# Patient Record
Sex: Female | Born: 1935 | Race: White | Hispanic: No | Smoking: Never smoker
Health system: Southern US, Community
[De-identification: ages and names within clinical notes are randomized; demographics above are authoritative.]

## PROBLEM LIST (undated history)

## (undated) DIAGNOSIS — K623 Rectal prolapse: Secondary | ICD-10-CM

## (undated) DIAGNOSIS — I509 Heart failure, unspecified: Secondary | ICD-10-CM

## (undated) DIAGNOSIS — M1990 Unspecified osteoarthritis, unspecified site: Secondary | ICD-10-CM

## (undated) DIAGNOSIS — J449 Chronic obstructive pulmonary disease, unspecified: Secondary | ICD-10-CM

## (undated) HISTORY — PX: OTHER SURGICAL HISTORY: SHX169

---

## 2007-06-04 ENCOUNTER — Other Ambulatory Visit: Payer: Self-pay

## 2007-06-04 ENCOUNTER — Inpatient Hospital Stay: Payer: Self-pay | Admitting: Internal Medicine

## 2011-10-28 ENCOUNTER — Inpatient Hospital Stay: Payer: Self-pay | Admitting: Orthopedic Surgery

## 2011-11-02 ENCOUNTER — Encounter: Payer: Self-pay | Admitting: Internal Medicine

## 2011-11-26 ENCOUNTER — Encounter: Payer: Self-pay | Admitting: Internal Medicine

## 2012-06-04 ENCOUNTER — Inpatient Hospital Stay: Payer: Self-pay | Admitting: Internal Medicine

## 2012-06-04 LAB — URINALYSIS, COMPLETE
Bilirubin,UR: NEGATIVE
Ketone: NEGATIVE
Nitrite: POSITIVE
Protein: 100
RBC,UR: 14 /HPF (ref 0–5)
Squamous Epithelial: NONE SEEN
WBC UR: 49 /HPF (ref 0–5)

## 2012-06-04 LAB — CBC
HCT: 40.7 % (ref 35.0–47.0)
HGB: 13.5 g/dL (ref 12.0–16.0)
MCH: 30.6 pg (ref 26.0–34.0)
MCV: 92 fL (ref 80–100)
RDW: 15.5 % — ABNORMAL HIGH (ref 11.5–14.5)
WBC: 3.2 10*3/uL — ABNORMAL LOW (ref 3.6–11.0)

## 2012-06-04 LAB — COMPREHENSIVE METABOLIC PANEL
Albumin: 3.3 g/dL — ABNORMAL LOW (ref 3.4–5.0)
Alkaline Phosphatase: 149 U/L — ABNORMAL HIGH (ref 50–136)
BUN: 25 mg/dL — ABNORMAL HIGH (ref 7–18)
Bilirubin,Total: 2 mg/dL — ABNORMAL HIGH (ref 0.2–1.0)
Calcium, Total: 8.7 mg/dL (ref 8.5–10.1)
Creatinine: 1.11 mg/dL (ref 0.60–1.30)
Osmolality: 294 (ref 275–301)
Sodium: 145 mmol/L (ref 136–145)

## 2012-06-04 LAB — APTT: Activated PTT: 36.2 secs — ABNORMAL HIGH (ref 23.6–35.9)

## 2012-06-04 LAB — PROTIME-INR: INR: 1.4

## 2012-06-04 LAB — CK TOTAL AND CKMB (NOT AT ARMC): CK, Total: 363 U/L — ABNORMAL HIGH (ref 21–215)

## 2012-06-04 LAB — PRO B NATRIURETIC PEPTIDE: B-Type Natriuretic Peptide: 2954 pg/mL — ABNORMAL HIGH (ref 0–450)

## 2012-06-05 LAB — CBC WITH DIFFERENTIAL/PLATELET
Basophil #: 0 10*3/uL (ref 0.0–0.1)
Basophil %: 0.1 %
Eosinophil #: 0 10*3/uL (ref 0.0–0.7)
HCT: 34.1 % — ABNORMAL LOW (ref 35.0–47.0)
HGB: 11.5 g/dL — ABNORMAL LOW (ref 12.0–16.0)
MCH: 31.2 pg (ref 26.0–34.0)
MCV: 92 fL (ref 80–100)
Monocyte #: 0.5 x10 3/mm (ref 0.2–0.9)
Monocyte %: 5.7 %
Neutrophil %: 89.3 %
RBC: 3.71 10*6/uL — ABNORMAL LOW (ref 3.80–5.20)
RDW: 15.6 % — ABNORMAL HIGH (ref 11.5–14.5)
WBC: 9 10*3/uL (ref 3.6–11.0)

## 2012-06-05 LAB — BASIC METABOLIC PANEL
Anion Gap: 14 (ref 7–16)
Calcium, Total: 8.1 mg/dL — ABNORMAL LOW (ref 8.5–10.1)
Chloride: 108 mmol/L — ABNORMAL HIGH (ref 98–107)
Co2: 22 mmol/L (ref 21–32)
Creatinine: 1.21 mg/dL (ref 0.60–1.30)
EGFR (Non-African Amer.): 39 — ABNORMAL LOW
Glucose: 209 mg/dL — ABNORMAL HIGH (ref 65–99)
Osmolality: 298 (ref 275–301)
Sodium: 144 mmol/L (ref 136–145)

## 2012-06-05 LAB — TROPONIN I: Troponin-I: 0.1 ng/mL — ABNORMAL HIGH

## 2012-06-07 LAB — CBC WITH DIFFERENTIAL/PLATELET
Basophil %: 0.2 %
Lymphocyte %: 14.7 %
MCH: 31.2 pg (ref 26.0–34.0)
MCHC: 33.6 g/dL (ref 32.0–36.0)
Monocyte #: 1 x10 3/mm — ABNORMAL HIGH (ref 0.2–0.9)
Neutrophil #: 6.7 10*3/uL — ABNORMAL HIGH (ref 1.4–6.5)
Platelet: 50 10*3/uL — ABNORMAL LOW (ref 150–440)
RDW: 16.1 % — ABNORMAL HIGH (ref 11.5–14.5)

## 2012-06-07 LAB — BASIC METABOLIC PANEL
BUN: 21 mg/dL — ABNORMAL HIGH (ref 7–18)
Chloride: 112 mmol/L — ABNORMAL HIGH (ref 98–107)
Co2: 25 mmol/L (ref 21–32)
Creatinine: 0.99 mg/dL (ref 0.60–1.30)
EGFR (Non-African Amer.): 50 — ABNORMAL LOW
Sodium: 143 mmol/L (ref 136–145)

## 2012-06-07 LAB — CULTURE, BLOOD (SINGLE)

## 2012-06-07 LAB — MAGNESIUM: Magnesium: 1.7 mg/dL — ABNORMAL LOW

## 2013-06-08 ENCOUNTER — Emergency Department: Payer: Self-pay | Admitting: Emergency Medicine

## 2013-12-12 ENCOUNTER — Inpatient Hospital Stay: Payer: Self-pay | Admitting: Internal Medicine

## 2013-12-12 LAB — CBC
HCT: 34.2 % — ABNORMAL LOW (ref 35.0–47.0)
HGB: 10.6 g/dL — ABNORMAL LOW (ref 12.0–16.0)
MCH: 25.2 pg — ABNORMAL LOW (ref 26.0–34.0)
MCHC: 30.9 g/dL — ABNORMAL LOW (ref 32.0–36.0)
MCV: 82 fL (ref 80–100)
WBC: 16.3 10*3/uL — ABNORMAL HIGH (ref 3.6–11.0)

## 2013-12-12 LAB — COMPREHENSIVE METABOLIC PANEL
Albumin: 3.2 g/dL — ABNORMAL LOW (ref 3.4–5.0)
Anion Gap: 9 (ref 7–16)
BUN: 26 mg/dL — ABNORMAL HIGH (ref 7–18)
Bilirubin,Total: 1.2 mg/dL — ABNORMAL HIGH (ref 0.2–1.0)
Chloride: 118 mmol/L — ABNORMAL HIGH (ref 98–107)
Co2: 22 mmol/L (ref 21–32)
Creatinine: 0.95 mg/dL (ref 0.60–1.30)
EGFR (African American): 60 — ABNORMAL LOW
EGFR (Non-African Amer.): 52 — ABNORMAL LOW
Glucose: 153 mg/dL — ABNORMAL HIGH (ref 65–99)
Osmolality: 304 (ref 275–301)
Potassium: 3.6 mmol/L (ref 3.5–5.1)
SGOT(AST): 37 U/L (ref 15–37)

## 2013-12-12 LAB — CK-MB
CK-MB: 5.6 ng/mL — ABNORMAL HIGH (ref 0.5–3.6)
CK-MB: 5.8 ng/mL — ABNORMAL HIGH (ref 0.5–3.6)

## 2013-12-12 LAB — TROPONIN I: Troponin-I: 0.07 ng/mL — ABNORMAL HIGH

## 2013-12-12 LAB — URINALYSIS, COMPLETE
Leukocyte Esterase: NEGATIVE
Nitrite: POSITIVE
Ph: 6 (ref 4.5–8.0)
Protein: 100
Squamous Epithelial: NONE SEEN

## 2013-12-12 LAB — CK TOTAL AND CKMB (NOT AT ARMC): CK-MB: 3.6 ng/mL (ref 0.5–3.6)

## 2013-12-12 LAB — PRO B NATRIURETIC PEPTIDE: B-Type Natriuretic Peptide: 3022 pg/mL — ABNORMAL HIGH (ref 0–450)

## 2013-12-12 LAB — MAGNESIUM: Magnesium: 1.5 mg/dL — ABNORMAL LOW

## 2013-12-13 LAB — CBC WITH DIFFERENTIAL/PLATELET
Basophil %: 1.2 %
Eosinophil #: 0.5 10*3/uL (ref 0.0–0.7)
Lymphocyte #: 1.8 10*3/uL (ref 1.0–3.6)
Lymphocyte %: 31.6 %
MCHC: 32.8 g/dL (ref 32.0–36.0)
Monocyte #: 0.6 x10 3/mm (ref 0.2–0.9)
Neutrophil %: 48.4 %
Platelet: 63 10*3/uL — ABNORMAL LOW (ref 150–440)
RDW: 19.2 % — ABNORMAL HIGH (ref 11.5–14.5)

## 2013-12-13 LAB — BASIC METABOLIC PANEL
Anion Gap: 6 — ABNORMAL LOW (ref 7–16)
BUN: 33 mg/dL — ABNORMAL HIGH (ref 7–18)
Chloride: 115 mmol/L — ABNORMAL HIGH (ref 98–107)
Co2: 24 mmol/L (ref 21–32)
Creatinine: 1.12 mg/dL (ref 0.60–1.30)
EGFR (Non-African Amer.): 42 — ABNORMAL LOW
Glucose: 80 mg/dL (ref 65–99)
Osmolality: 295 (ref 275–301)
Potassium: 3.5 mmol/L (ref 3.5–5.1)
Sodium: 145 mmol/L (ref 136–145)

## 2013-12-14 LAB — BASIC METABOLIC PANEL
Anion Gap: 6 — ABNORMAL LOW (ref 7–16)
BUN: 29 mg/dL — ABNORMAL HIGH (ref 7–18)
Calcium, Total: 8.3 mg/dL — ABNORMAL LOW (ref 8.5–10.1)
Creatinine: 0.75 mg/dL (ref 0.60–1.30)
EGFR (African American): >60
EGFR (Non-African Amer.): >60
Glucose: 82 mg/dL (ref 65–99)
Sodium: 142 mmol/L (ref 136–145)

## 2013-12-14 LAB — CBC WITH DIFFERENTIAL/PLATELET
Basophil #: 0.1 10*3/uL (ref 0.0–0.1)
Eosinophil %: 9.2 %
HGB: 9.7 g/dL — ABNORMAL LOW (ref 12.0–16.0)
Lymphocyte #: 1.7 10*3/uL (ref 1.0–3.6)
Lymphocyte %: 28.2 %
MCH: 25.9 pg — ABNORMAL LOW (ref 26.0–34.0)
Platelet: 71 10*3/uL — ABNORMAL LOW (ref 150–440)
RDW: 19.2 % — ABNORMAL HIGH (ref 11.5–14.5)

## 2013-12-15 LAB — BASIC METABOLIC PANEL
Anion Gap: 6 — ABNORMAL LOW (ref 7–16)
Calcium, Total: 8.1 mg/dL — ABNORMAL LOW (ref 8.5–10.1)
Chloride: 111 mmol/L — ABNORMAL HIGH (ref 98–107)
Co2: 26 mmol/L (ref 21–32)
EGFR (Non-African Amer.): 49 — ABNORMAL LOW
Glucose: 80 mg/dL (ref 65–99)
Potassium: 3.6 mmol/L (ref 3.5–5.1)
Sodium: 143 mmol/L (ref 136–145)

## 2015-01-05 ENCOUNTER — Emergency Department: Payer: Self-pay | Admitting: Emergency Medicine

## 2015-01-05 LAB — BASIC METABOLIC PANEL
Anion Gap: 10 (ref 7–16)
BUN: 16 mg/dL (ref 7–18)
CHLORIDE: 109 mmol/L — AB (ref 98–107)
CO2: 26 mmol/L (ref 21–32)
Calcium, Total: 7.9 mg/dL — ABNORMAL LOW (ref 8.5–10.1)
Creatinine: 1.21 mg/dL (ref 0.60–1.30)
GFR CALC AF AMER: 53 — AB
GFR CALC NON AF AMER: 44 — AB
Glucose: 132 mg/dL — ABNORMAL HIGH (ref 65–99)
OSMOLALITY: 292 (ref 275–301)
Potassium: 3.1 mmol/L — ABNORMAL LOW (ref 3.5–5.1)
Sodium: 145 mmol/L (ref 136–145)

## 2015-01-05 LAB — URINALYSIS, COMPLETE
Bilirubin,UR: NEGATIVE
Glucose,UR: NEGATIVE mg/dL (ref 0–75)
KETONE: NEGATIVE
NITRITE: POSITIVE
Ph: 5 (ref 4.5–8.0)
Specific Gravity: 1.018 (ref 1.003–1.030)
Squamous Epithelial: <1

## 2015-01-05 LAB — CBC
HCT: 34.9 % — ABNORMAL LOW (ref 35.0–47.0)
HGB: 11 g/dL — AB (ref 12.0–16.0)
MCH: 25.7 pg — ABNORMAL LOW (ref 26.0–34.0)
MCHC: 31.6 g/dL — ABNORMAL LOW (ref 32.0–36.0)
MCV: 81 fL (ref 80–100)
Platelet: 79 10*3/uL — ABNORMAL LOW (ref 150–440)
RBC: 4.29 10*6/uL (ref 3.80–5.20)
RDW: 22 % — ABNORMAL HIGH (ref 11.5–14.5)
WBC: 15.7 10*3/uL — AB (ref 3.6–11.0)

## 2015-01-05 LAB — TROPONIN I: Troponin-I: 0.05 ng/mL

## 2015-01-09 LAB — URINE CULTURE

## 2015-04-17 NOTE — Discharge Summary (Signed)
PATIENT NAME:  Mia Solomon, Mia Solomon MR#:  [id number] DATE OF BIRTH:  04/04/1918  DATE OF ADMISSION:  12/12/2013 DATE OF DISCHARGE:  12/16/2013  ADMITTING DIAGNOSIS: Shortness of breath.   DISCHARGE DIAGNOSES: 1.  Acute respiratory failure due to acute diastolic congestive heart failure as well as possible pneumonia, now resolved.  2.  Possible pneumonia. The patient's symptoms improved.  3.  Lower extremity swelling due to congestive heart failure, now improved. Negative Dopplers for deep vein thrombosis.  4.  Glaucoma.  5.  Thrombocytopenia which appears to be chronic. Needs to have outpatient follow-up.  6.  Hypothyroidism.  7.  History of atrial fibrillation. Not a good anticoagulation candidate. Has been treated with aspirin only.  8.  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 9.  History of diverticulosis.  10.  History of restless leg syndrome. 11.  History of osteoarthritis.  12.  History of Clostridium difficile.  13.  Status post appendectomy.  14.  Status post cholecystectomy.  15.  Status post back surgery. 16.  Status post left shoulder surgery.  17.  Status post left total knee replacement.  18.  Status post ORIF of left hip fracture in November 2012.  19.  Possible urinary tract infection.  20. Elevated troponin, possibly due to demand ischemia.  CONSULTANTS: Cardiology, Dr. Gwen PoundsKowalski.  EVALUATIONS: EKG on admission shows sinus tachycardia with occasional PVCs. Urinalysis: RBCs 248, WBCs 20, bacteria 2+.  WBC count was 16.3, hemoglobin 10.6, platelet count 137. BNP 3022. Chest x-ray shows dense right base opacity which could represent a pneumonia. Also there is on the right base either atelectasis or  fluid. Nonspecific retrocardiac infiltrate. Blood cultures: No growth x2. Troponin was 0.29 and 0.37. Echocardiogram showed EF of 50% to 55%, mild mitral valve leaflet thickening, mild mitral valve regurg, mildly increased left ventricular internal cavity size, mild aortic regurg,  mild aortic valve stenosis, mild to moderate tricuspid regurg. Repeat chest x-ray on the 21st showed improving edema pattern, continued bibasilar opacities, improved. WBC count was 6.2 on the 20th.  HOSPITAL COURSE: The patient is a 81 year old white female who resides at home with around the clock care who presented with progression of shortness of breath. The patient was in severe respiratory distress and had to be placed on BiPAP. She was thought to have a combination of CHF and pneumonia. She was treated with IV antibiotics and IV Lasix. The patient also was found to have possible A-fib with RVR on presentation, improved with Cardizem. She was admitted to the hospital, continued on diuresis, as well as antibiotics. With these treatments her oxygen requirements normalized. She did not need any further oxygen. She underwent an echocardiogram which showed multivalvular disease as well as diastolic dysfunction. At this time, the patient is doing much better. Her breathing is improved and she is stable for discharge.   DISCHARGE MEDICATIONS:  1.  Synthroid 150 mcg 1 tab p.o. daily. 2.  Requip 0.5 mg 1 tab p.o. at bedtime. 3.  Benzonatate 100 mg 1 tab p.o. t.i.d. 4.  Latanoprost 0.005% one drop to each affected eye at bedtime. 5.  Xalatan 0.005% to affected eye b.i.d. 6.  Acetaminophen 650 mg q. 4 p.r.n. for pain. 7.  Metoprolol succinate 25 mg daily. 8.  Lasix 20 mg 1 tab p.o. daily. 9.  Ceftin 500 mg 1 tab p.o. b.i.d.   HOME OXYGEN: None.  HOME HEALTH SERVICES: Physical therapy and nurse.   DIET: Low sodium.   ACTIVITY: As tolerated.   DISCHARGE FOLLOWUP: Follow  up with primary MD in 1 to 2 weeks.  TIME SPENT: 35 minutes. ____________________________ Lacie Scotts Allena Katz, MD shp:sb D: 12/17/2013 08:02:00 ET T: 12/17/2013 08:21:49 ET JOB#: [id number]  cc: Daelyn Pettaway H. Allena Katz, MD, <Dictator> Charise Carwin MD ELECTRONICALLY SIGNED 12/20/2013 16:02

## 2015-04-17 NOTE — H&P (Signed)
PATIENT NAME:  Mia Solomon, Mia Solomon MR#:  [id number] DATE OF BIRTH:  06-16-1918  DATE OF ADMISSION:  12/12/2013  REFERRING PHYSICIAN: Dr. Marta Antu.   PRIMARY CARE PHYSICIAN: Used to follow with Dr. Apolonio Schneiders. Currently following with his replacement but cannot recall his name.   CHIEF COMPLAINT: Shortness of breath.   HISTORY OF PRESENT ILLNESS: This is a 81 year old female who lives at home with 24/7 hour assistance, ambulates at baseline with minimal assistance. Presents with complaints of shortness of breath. Reports it has been going on for the last day but did worsen this evening. The patient denies any cough, any chills but reports she had upper respiratory illness recently. The patient was in severe respiratory distress where she required BiPAP in the ED. The patient was afebrile. She denied any fever or any chills. Reports some mild cough but nonproductive. As well, she felt some palpitations. As well, reports new onset bilateral lower extremity edema. She denies any chest pain. The patient's chest x-ray did show new opacity, with evidence of cardiomegaly as well. The patient denies any history of CHF. The patient was started on Rocephin and Zithromax for a diagnosis of pneumonia. As well, the patient had bibasilar rales upon presentation. She was given 40 of IV Lasix with significant improvement of her symptoms. The patient had 40 mg of IV Lasix push in the ED with improvement of her symptoms. She does not have a previous diagnosis of CHF. As well, the patient's sodium was found to be elevated at 149, and troponin elevated at 0.07. She denies any chest pain. Complains of palpitations. The patient was found to be in Afib with RVR, with mild improvement after 5 mg of IV diltiazem. Initial rate upon presentation was 160. Currently, she is in the low 110s to 120s. She had leukocytosis at 16,000. Hospitalist service was requested to admit the patient for further management of her respiratory failure and  treatment of pneumonia and new onset CHF and Afib.   PAST MEDICAL HISTORY:  1. Coronary artery disease.  2. Hypothyroidism.  3. Diverticulosis.  4. Restless leg syndrome.  5. Osteoarthritis.  6. History of C. diff colitis.   PAST SURGICAL HISTORY:  1. Appendectomy.  2. Cholecystectomy.  3. Back surgery.  4. Left shoulder surgery.  5. Left total knee replacement.  6. Status post ORIF of left hip fracture November 2012.   ALLERGIES: CODEINE AND HYDROCODONE.   HOME MEDICATIONS:  1. Requip 0.5 mg at bedtime.  2. Benzonatate 100 mg oral 3 times a day.  3. Latanoprost ophthalmic solution 0.005% one drop each eye at bedtime.  4. Synthroid 150 mcg oral daily.   REVIEW OF SYSTEMS:  CONSTITUTIONAL: The patient denies fever, chills. Complains of fatigue, weakness. Denies weight gain, weight loss.  EYES: Denies blurry vision, double vision, inflammation, glaucoma.  ENT: Denies tinnitus, ear pain, hearing loss, epistaxis. Complains of runny nose.  RESPIRATORY: Complains of cough, nonproductive. Reports shortness of breath. Denies history of COPD.  CARDIOVASCULAR: Denies chest pain, arrhythmia, syncope. Reports some palpitation and worsening lower extremity edema.  GASTROINTESTINAL: Denies nausea, vomiting, diarrhea, abdominal pain, hematemesis, melena, rectal bleed.  GENITOURINARY: Denies dysuria, hematuria, renal colic.  ENDOCRINE: Denies polyuria, polydipsia, heat or cold intolerance.  HEMATOLOGY: Denies anemia, easy bruising, bleeding diathesis.  INTEGUMENTARY: Denies any acne, rash or skin lesions.  MUSCULOSKELETAL: Reports history of arthritis. Denies any cramps or joint swelling. NEUROLOGIC: Denies history of CVA, TIA, headache, vertigo, tremors.  PSYCHIATRIC: Denies anxiety, insomnia, bipolar disorder or schizophrenia.  PHYSICAL EXAMINATION:  VITAL SIGNS: Temperature 98.6, pulse 124, respiratory rate 28, blood pressure 103/47, saturating 100% on BiPAP.  GENERAL: Elderly female on  BiPAP, comfortable, in no apparent distress.  HEENT: Head atraumatic, normocephalic. Pupils equal, reactive to light. Pink conjunctivae. Anicteric sclerae. Dry oral mucosa. Wearing facial BiPAP mask.  NECK: Supple. No thyromegaly. No JVD.   CHEST: The patient had fair air entry bilaterally with bibasilar rales. No wheezing.  CARDIOVASCULAR: S1, S2 heard. Irregularly irregular, tachycardic.  ABDOMEN: Soft, nontender, nondistended. Bowel sounds present.  EXTREMITIES: Edema +2 bilaterally. No clubbing. No cyanosis. Dorsal pedal pulses felt bilaterally.  PSYCHIATRIC: Appropriate affect. Awake, alert x 3. Intact judgment and insight.  NEUROLOGIC: Cranial nerves grossly intact. Motor 5 out of 5. No focal deficits.  SKIN: Normal skin turgor. Warm and dry.  LYMPHATIC: No cervical lymphadenopathy.   PERTINENT LABORATORIES: BNP 3022. Glucose 153, BUN 26, creatinine 0.95, sodium 149, potassium 3.6, chloride 118, CO2 22. ALT 20, AST 37, alk phos 73. Troponin 0.07., hemoglobin 10.6, hematocrit 34.2, platelets 137. Urinalysis positive for nitrite and 20 white blood cells, +2 bacteria.   EKG showing sinus tachycardia at 160 beats per minutes. On telemetry monitor, she is showing Afib.   IMAGING: Chest x-ray showing dense right base opacity which could represent pneumonia. Also at right base, there is atelectasis or fissure fluid. Nonspecific retrocardiac infiltrate and cardiomegaly without edema.   ASSESSMENT AND PLAN:  1. Acute respiratory failure: Currently appears to be much improved after the patient has been on BiPAP and receiving IV Lasix. Appears to be multifactorial due to pneumonia and congestive heart failure.  2. Pneumonia: The patient will be treated for community-acquired pneumonia. Blood cultures were sent. Will be started on Rocephin and Zithromax.  3. Congestive heart failure: The patient does not have a diagnosis of congestive heart failure in the past. Will check echocardiogram. Will continue  to cycle her cardiac enzymes and will have her on gentle diuresis, 20 mg of intravenous IV Lasix twice a day. Will order only 2 doses, especially if she is having some hypernatremia and low blood pressure. Will consult cardiology.  4. Hypernatremia: Will have the patient on D5W at very low rate, total of 250 mL, due to her congestive heart failure.  5. Atrial fibrillation with rapid ventricular response: Due to the patient's low blood pressure, will give her 1 dose of digoxin at this point. Will check the patient's echocardiogram.   6. Mildly elevated troponin: This is most likely demand ischemia from her respiratory failure and atrial fibrillation. Will continue to cycle cardiac enzymes. Will order her 324 mg of p.o. aspirin.  7. Hypothyroidism: Continue with Synthroid.  8. Restless leg syndrome: Continue with Requip.   9. Deep vein thrombosis prophylaxis: Subcutaneous heparin.   CODE STATUS: The patient reports she wishes to be FULL CODE.   TOTAL TIME SPENT ON ADMISSION AND PATIENT CARE: 55 minutes.   ____________________________ Albertine Patricia, MD dse:gb D: 12/12/2013 04:47:17 ET T: 12/12/2013 05:18:44 ET JOB#: [id number]  cc: Albertine Patricia, MD, <Dictator> DAWOOD Graciela Husbands MD ELECTRONICALLY SIGNED 12/13/2013 6:51

## 2015-04-17 NOTE — Consult Note (Signed)
General Aspect 81 year old female that presented with respiratory distress, tachycardia, leukocytosis, and felt to have pneumonia.  BNP is elevated, and chest x-ray showed mild   venous congestion but no frank edema.  Patient had noticed increased shortness of breath and lower extremity edema for one day prior to presenting to the ER..  Does not have history of heart failure or Atrial fibrillation, which was questioned on admission..  She did have a tachycardia with a bundle branch on presentation and monitor has continued to show sinus rhythm with premature contractions, but no definite atrial fibrillation.  Patient is now feeling much better after being on BiPAP for a short period of time and administration of antibiotics.  She is getting mild diuresis.  She does have  lower extremity edema, which she states is not entirely new but just worse.  She denies any chest pain or shortness breath currently.  Echocardiogram is pending, as well as Dopplers of the lower extremities to assess for DVT.  Troponin   is elevated probably secondary to demand ischemia, not  acute coronary syndrome. She is awaiting transfer to a bed on the telemetry unit. Magnesium is low and is pending replacement.   Physical Exam:  GEN no acute distress, thin   HEENT pink conjunctivae   NECK supple   RESP normal resp effort   CARD Irregular rate and rhythm  Normal, S1, S2  Irregular, consistent with premature contractions   ABD denies tenderness  soft   EXTR positive edema   SKIN skin turgor poor   NEURO cranial nerves intact, motor/sensory function intact   PSYCH alert, A+O to time, place, person   Review of Systems:  Subjective/Chief Complaint Dyspnea, swelling   Cardiovascular: Edema  Worse of the lower extremities recently   Review of Systems: All other systems were reviewed and found to be negative   Medications/Allergies Reviewed Medications/Allergies reviewed   Lab Results: Hepatic:  18-Dec-14 02:27    Bilirubin, Total  1.2  Alkaline Phosphatase 73 (45-117 NOTE: New Reference Range 11/15/13)  SGPT (ALT) 20  SGOT (AST) 37  Total Protein, Serum 6.7  Albumin, Serum  3.2  Routine Micro:  18-Dec-14 04:30   Micro Text Report BLOOD CULTURE   COMMENT                   NO GROWTH IN 8-12 HOURS   ANTIBIOTIC                       Culture Comment NO GROWTH IN 8-12 HOURS  Result(s) reported on 12 Dec 2013 at 01:00PM.    04:35   Micro Text Report BLOOD CULTURE   COMMENT                   NO GROWTH IN 8-12 HOURS   ANTIBIOTIC                       Culture Comment NO GROWTH IN 8-12 HOURS  Result(s) reported on 12 Dec 2013 at 01:00PM.  Lab:  18-Dec-14 02:54   pH (ABG)  7.36  PCO2  31  PO2  242  FiO2 100  Base Excess  -6.8  HCO3  17.5  O2 Saturation 98.0  O2 Device BIPAP  Specimen Site (ABG) RT RADIAL  Specimen Type (ABG) ARTERIAL  Patient Temp (ABG) 37.0  PSV 12  PEEP 6.0  Mechanical Rate 10 (Result(s) reported on 12 Dec 2013 at 02:57AM.)  Cardiology:  18-Dec-14 02:22   Ventricular Rate 160  Atrial Rate 160  P-R Interval 128  QRS Duration 116  QT 290  QTc 473  R Axis -43  T Axis 112  ECG interpretation Sinus tachycardia with occasional Premature ventricular complexes and Fusion complexes Left axis deviation Incomplete right bundle branch block Septal infarct (cited on or before 04-Jun-2012) ST & T wave abnormality, consider lateral ischemia Abnormal ECG When compared with ECG of 04-Jun-2012 18:06, Fusion complexes are now Present Premature ventricular complexes are now Present QRS duration has increased ----------unconfirmed---------- Confirmed by OVERREAD, NOT ([id number]), editor PEARSON,BARBARA ([id number]) on 12/12/2013 10:20:41 AM  Routine Chem:  18-Dec-14 02:27   Result Comment TROPONIN - RESULTS VERIFIED BY REPEAT TESTING.  - CALLED TO KATIE NEWSHOLME AT 0330 ON  - 12/12/2013.Marland KitchenMarland KitchenTPL  - READ-BACK PROCESS PERFORMED.  Result(s) reported on 12 Dec 2013 at 03:33AM.  B-Type  Natriuretic Peptide [facility])  3022 (Result(s) reported on 12 Dec 2013 at 03:23AM.)  Glucose, Serum  153  BUN  26  Creatinine (comp) 0.95  Sodium, Serum  149  Potassium, Serum 3.6  Chloride, Serum  118  CO2, Serum 22  Calcium (Total), Serum 8.6  Osmolality (calc) 304  eGFR (African American)  60  eGFR (Non-African American)  52 (eGFR values <42m/min/1.73 m2 may be an indication of chronic kidney disease (CKD). Calculated eGFR is useful in patients with stable renal function. The eGFR calculation will not be reliable in acutely ill patients when serum creatinine is changing rapidly. It is not useful in  patients on dialysis. The eGFR calculation may not be applicable to patients at the low and high extremes of body sizes, pregnant women, and vegetarians.)  Anion Gap 9    07:19   Result Comment TROPONIN - RESULTS VERIFIED BY REPEAT TESTING.  - CALLED @ 0330 ON 12/12/13/TPL  Result(s) reported on 12 Dec 2013 at 07:58AM.    10:39   Magnesium, Serum  1.5 (1.8-2.4 THERAPEUTIC RANGE: 4-7 mg/dL TOXIC: > 10 mg/dL  -----------------------)  Result Comment TROPONIN - RESULTS VERIFIED BY REPEAT TESTING.  - READ-BACK PROCESS PERFORMED.  - CHELSEA WHITT 12/12/13 _0 ..Marland KitchenMarland KitchenTV  Result(s) reported on 12 Dec 2013 at 11:28AM.  Cardiac:  18-Dec-14 02:27   CPK-MB, Serum 3.6 (Result(s) reported on 12 Dec 2013 at 03:23AM.)  Troponin I  0.07 (0.00-0.05 0.05 ng/mL or less: NEGATIVE  Repeat testing in 3-6 hrs  if clinically indicated. >0.05 ng/mL: POTENTIAL  MYOCARDIAL INJURY. Repeat  testing in 3-6 hrs if  clinically indicated. NOTE: An increase or decrease  of 30% or more on serial  testing suggests a  clinically important change)  CK, Total 85    07:19   CPK-MB, Serum  5.6 (Result(s) reported on 12 Dec 2013 at 07:56AM.)  Troponin I  0.29 (0.00-0.05 0.05 ng/mL or less: NEGATIVE  Repeat testing in 3-6 hrs  if clinically indicated. >0.05 ng/mL: POTENTIAL  MYOCARDIAL INJURY. Repeat   testing in 3-6 hrs if  clinically indicated. NOTE: An increase or decrease  of 30% or more on serial  testing suggests a  clinically important change)    10:39   CPK-MB, Serum  5.8 (Result(s) reported on 12 Dec 2013 at 11:23AM.)  Troponin I  0.37 (0.00-0.05 0.05 ng/mL or less: NEGATIVE  Repeat testing in 3-6 hrs  if clinically indicated. >0.05 ng/mL: POTENTIAL  MYOCARDIAL INJURY. Repeat  testing in 3-6 hrs if  clinically indicated. NOTE: An increase or decrease  of 30% or more on serial  testing suggests a  clinically important change)  Routine UA:  18-Dec-14 02:27   Color (UA) Amber  Clarity (UA) Cloudy  Glucose (UA) Negative  Bilirubin (UA) Negative  Ketones (UA) Trace  Specific Gravity (UA) 1.017  Blood (UA) 1+  pH (UA) 6.0  Protein (UA) 100 mg/dL  Nitrite (UA) Positive  Leukocyte Esterase (UA) Negative (Result(s) reported on 12 Dec 2013 at 03:59AM.)  RBC (UA) 248 /HPF  WBC (UA) 20 /HPF  Bacteria (UA) 2+  Epithelial Cells (UA) NONE SEEN  Hyaline Cast (UA) 9 /LPF (Result(s) reported on 12 Dec 2013 at 03:59AM.)  Routine Hem:  18-Dec-14 02:27   WBC (CBC)  16.3  RBC (CBC) 4.19  Hemoglobin (CBC)  10.6  Hematocrit (CBC)  34.2  Platelet Count (CBC)  137 (Result(s) reported on 12 Dec 2013 at 03:20AM.)  MCV 82  MCH  25.2  MCHC  30.9  RDW  19.4   Radiology Results: XRay:    18-Dec-14 02:41, Chest Portable Single View  Chest Portable Single View   REASON FOR EXAM:    resp distress  COMMENTS:       PROCEDURE: DXR - DXR PORTABLE CHEST SINGLE VIEW  - Dec 12 2013  2:41AM     CLINICAL DATA:  Respiratory distress.    EXAM:  PORTABLE CHEST - 1 VIEW    COMPARISON:  06/04/2012.    FINDINGS:  Cardiomegaly with venous congestion, but no frank pulmonary edema.  Dense opacity at the right base, with upper margins following the  minor fissure. Indistinct left base opacity, likely with small  pleural effusion. No pneumothorax. Severe glenohumeral  osteoarthritis on the  right. Glenohumeral arthroplasty in the left.     IMPRESSION:  1. Dense right base opacity which could represent pneumonia. Also at  the right base, there is either atelectasis or fissural fluid.  2. Nonspecific retrocardiac infiltrate.  3. Cardiomegaly without edema.      Electronically Signed    By: Jorje Guild M.D.    On: 12/12/2013 02:49     Verified By: Gilford Silvius, M.D.,    Codeine: Itching  Hydrocodone CP: Unknown  Vital Signs/Nurse's Notes: **Vital Signs.:   18-Dec-14 13:00  Vital Signs Type Routine  Pulse Pulse 76  Respirations Respirations 22  Systolic BP Systolic BP 314  Diastolic BP (mmHg) Diastolic BP (mmHg) 44  Mean BP 69  Pulse Ox % Pulse Ox % 98  Pulse Ox Activity Level  At rest  Oxygen Delivery 3L; Nasal Cannula  Pulse Ox Heart Rate 14    Impression 81 year old female presenting with pneumonia and acute congestive heart failure , echo pending to determine type of  failure,  probably exacerbated by pneumonia, tachycardia and hypoxia, elevated troponin  probably secondary to demand ischemia and  not acute coronary syndrome.   Plan 1.  Continue treatment of pneumonia. 2.  Continue gentle diuresis. 3.  Replace magnesium. 4.  Doubt presence of  Atrial fibrillation. 5.  Echocardiogram pending with further recommendations per Dr. Nehemiah Massed after results are known   Electronic Signatures: Roderic Palau (NP)  (Signed 18-Dec-14 13:39)  Authored: General Aspect/Present Illness, History and Physical Exam, Review of System, Labs, Radiology, Allergies, Vital Signs/Nurse's Notes, Impression/Plan   Last Updated: 18-Dec-14 13:39 by Roderic Palau (NP)

## 2015-04-19 NOTE — H&P (Signed)
PATIENT NAME:  Mia Solomon, Mia Solomon MR#:  [id number] DATE OF BIRTH:  1918/01/08  DATE OF ADMISSION:  06/04/2012  ADDENDUM:  Considering that she has tachycardia with elevated troponins and she has a history of coronary artery disease, I will start her on low dose metoprolol. This can be further advanced if she tolerates it.    ____________________________ Fredia SorrowAbhinav Corrie Reder, MD ag:bjt D: 06/04/2012 21:31:47 ET T: 06/05/2012 07:14:12 ET JOB#: [id number]  cc: Fredia SorrowAbhinav Liliane Mallis, MD, <Dictator> Fredia SorrowABHINAV Mcclain Shall MD ELECTRONICALLY SIGNED 06/12/2012 12:56

## 2015-04-19 NOTE — Consult Note (Signed)
General Aspect patient is a 81 year old female resident of [facility] who was admitted after being noted to have fallen out of her motorized wheelchair with some altered mental status.  She was brought to the emergency room where she was noted to have evidence of dehydration with hypernatremia sodium of 145, hypokalemia with a potassium of 2.7, thrombocytopenia with a platelet count of 33,000, leukopenic with a white count of 3and a mildly elevated serum troponin of 0.10.  The patient has a history of coronary artery disease and per her report is status post coronary artery bypass grafting done in [place] in approximately 2006.  She denied any chest pain.  Since admission and being placed on antibiotics and gently hydrated with her potassium being repleted, her mental status has improved.  She denies any chest pain but does complain of weakness and fatigue.  She was significantly tachycardic on admission with a heart rate of 127 and febrile with a fever of 102.6.  she has evidence of gram-negative rods in blood cultures x2.  She is currently resting comfortably.  Electrocardiogram reveals sinus rhythm and sinus tachycardia with no ischemia.   Physical Exam:   GEN thin, disheveled    HEENT PERRL, hearing intact to voice    NECK No masses    RESP normal resp effort  no use of accessory muscles  rhonchi    CARD Regular rate and rhythm  Tachycardic  Murmur    Murmur Systolic    Systolic Murmur axilla    ABD denies tenderness  normal BS  no Adominal Mass    LYMPH negative neck, negative axillae    EXTR negative cyanosis/clubbing, negative edema    SKIN normal to palpation    NEURO cranial nerves intact, motor/sensory function intact    PSYCH alert, poor insight   Review of Systems:   Subjective/Chief Complaint admitted with weakness and fatigue    General: Fatigue  Fever/chills  Weakness    Skin: No Complaints    ENT: No Complaints    Eyes: No Complaints    Neck: No  Complaints    Respiratory: Short of breath    Cardiovascular: No Complaints    Gastrointestinal: No Complaints    Genitourinary: No Complaints    Vascular: No Complaints    Musculoskeletal: bilateral leg weakness    Neurologic: No Complaints    Hematologic: No Complaints    Endocrine: No Complaints    Psychiatric: No Complaints    Review of Systems: All other systems were reviewed and found to be negative    Medications/Allergies Reviewed Medications/Allergies reviewed     Spinal Stenosis:    CAD:    Arthritis:    thyroid disease:    Hypertension:    CABG (Coronary Artery Bypass Graft):   Home Medications: Medication Instructions Status  aspirin 81 mg oral tablet, chewable 1 tab(s) orally once a day at 8 AM Active  Synthroid 150 mcg (0.15 mg) oral tablet 1 tab(s) orally once a day (thyroid) at 7 AM Active  Vitamin D3 1000 intl units oral tablet 1 tab(s) orally once a day (supplement) at 8 AM Active  timolol 0.5% ophthalmic solution 1 drop(s) to each eye once a day (glaucoma) at 8 AM Active  pantoprazole 40 mg oral delayed release tablet 1 tab(s) orally once a day (GERD/reflux/stomach) at 7 AM Active  Oyster Shell Calcium with Vitamin D 500 mg-200 intl units oral tablet 1 tab(s) orally 2 times a day (calcium supplement) at 8 AM  and at 8 PM Active  Ferocon oral capsule 1 cap(s) orally 2 times a day with a meal for anemia (8 AM, 5 PM) Active  Requip 0.5 mg oral tablet 1 tab(s) orally once a day (at bedtime) for restless leg syndrome at 8 PM Active  Xalatan 0.005% ophthalmic solution 1 drop(s) to each eye once a day (at bedtime) (glaucoma) at 8 PM Active  oxycodone 5 mg oral tablet 1 tab(s) orally every 4 hours, As Needed for severe pain Active  tramadol 50 mg oral tablet 1 tab(s) orally every 6 hours, As Needed- for Pain  Active  Requip 0.5 mg oral tablet 1 tab(s) orally every 8 hours, As Needed for restless legs Active   EKG:   EKG NSR    Abnormal NSSTTW  changes    Interpretation sinus tachycardia    Codeine: Itching  Hydrocodone CP: Unknown    Impression 81 year old female with history of coronary artery disease status post coronary artery bypass grafting who was admitted after being noted to have fallen out of her wheelchair.  In the emergency room she was noted to have a fever of 102.6.  She was also thrombocytopenic, leukopenic, hypokalemic, hyponatremic with evidence of chronic kidney disease.  She was felt to be dehydrated area her tachycardia was felt to be secondary to her dehydration is well as fever.  She has a mild serum troponin elevation which appears to be secondary to demand ischemia secondary to her tachycardia, dehydration and underlying coronary artery disease.  She had no chest pain.  EKG reveals no ischemic changes.  She has gram-negative rods in her blood.  She is currently on antibiotic therapy has improved with rehydration and resuscitation of her potassium.    Plan 1.  Continue with current empiric antibiotics awaiting culture results regarding her gram-negative rod septicemia 2.  Consider echocardiogram to evaluate LV function.  Patient is not appear to be a candidate for invasive cardiac evaluation given comorbid condition and gram-negative rod sepsis 3.  Continue with low-dose beta blockers.  Would agree with holding aspirin therapy or other anticoagulation at present time given her thrombocytopenia and lack of active ischemia 4.  Gentle rehydration 5. further recommendations pending course   Electronic Signatures: Dalia Heading (MD)  (Signed 11-Jun-13 08:42)  Authored: General Aspect/Present Illness, History and Physical Exam, Review of System, Past Medical History, Home Medications, EKG , Allergies, Impression/Plan   Last Updated: 11-Jun-13 08:42 by Dalia Heading (MD)

## 2015-04-19 NOTE — H&P (Signed)
PATIENT NAME:  Mia Solomon, Mia Solomon MR#:  [id number] DATE OF BIRTH:  01/03/1918  DATE OF ADMISSION:  06/04/2012  PRIMARY CARE PHYSICIAN: Dr. Alonna BucklerAndrew Lamb. [facility]   CHIEF COMPLAINT: [facility] because she slipped from the wheelchair and had a high-grade fever of 102.8 on arrival.   HISTORY OF PRESENT ILLNESS: The patient is a 81 year old female who has a history of coronary artery disease with previous coronary artery bypass graft, hypothyroidism, diverticulosis, restless leg syndrome, and osteoarthritis. She was here in November of 2012 and she had an open reduction and internal fixation done for a left hip fracture. [facility]. She is also getting physical therapy there. Today she slipped from the wheelchair and also she was found to be shaking. When she came here she was found to have a temperature of 102.8 and she was quite tachycardic here with a heart rate of 127. The patient denies any shortness of breath, any cough, any chest pain, any abdominal pain, or any urinary complaints of dysuria or frequency. She denies any diarrhea. In the Emergency Room, she was noted to have a white count of 3.2, thrombocytopenic, and she was found to be dehydrated with a sodium of 145 and a potassium of 2.7. As per the ER physician, the patient had slightly altered mental status when she came in, but over the course of the Emergency Room stay her mental status has improved with IV hydration. She got a dose of Levaquin 500 mg IV in the Emergency Room along with 125 mg of Solu-Medrol, 20 mg of IV Lasix, and 40 mg of IV Protonix. Also noted by the ER physician was that she had black stools, guaiac-positive, but she is on iron pills. The patient is being admitted for systemic inflammatory response syndrome with leukopenia, fever, possible urinary tract infection, dehydration, and hypokalemia.   REVIEW OF SYSTEMS: Positive for fever.  She denies any weakness. No acute change in vision. No headache. No dizziness.  No cough. No dyspnea.  No chest pain. No nausea, vomiting, or abdominal pain. No hematemesis. She denies any dysuria or urinary frequency. She has thyroid problems. No anemia. She is complaining of some hip pain right now because of a fall bilaterally, some soreness.  No focal numbness or weakness.   PAST MEDICAL HISTORY:  1. Coronary artery disease with previous coronary artery bypass graft in 2004.  2. Hypothyroidism.  3. Diverticulosis. 4. Restless leg syndrome.  5. Osteoarthritis.  6. History of C. difficile colitis.  7. She was last admitted in November 2012. She had ORIF of the left hip fracture at that time.   PAST SURGICAL HISTORY:  1. Appendectomy.  2. Cholecystectomy. 3. Back surgery.  4. Left shoulder surgery 2004.  5. Right total knee replacement in 1998. 6. Status post ORIF of left hip fracture in November 2012.   ALLERGIES TO MEDICATIONS: Codeine and hydrocodone.   HOME MEDICATIONS:  1. Aspirin 81 mg daily. 2. Laurey MoraleFergon one capsule twice a day with meals.  3. Oxycodone 5 mg every four hours p.r.n.  4. Calcium with vitamin D twice a day.  5. Protonix 40 mg daily. 6. Requip 0.5 mg q. 8 hours and 0.5 mg at bedtime.  7. Synthroid 150 mcg daily.  8. Timolol ophthalmic solution once a day at 8:00 a.m.  9. Tramadol 50 mg every six hours p.r.n.  10. Vitamin D 3000 units daily.  11. Xalatan  eyedrops at bedtime to each eye.   SOCIAL HISTORY: She has been a resident of [address] for four years. She denies any smoking or alcohol use.   FAMILY HISTORY: She says her father had heart disease.   PHYSICAL EXAMINATION:  VITAL SIGNS: When she presented to the Emergency Room her temperature was 102.8, appendicitis 102.8, heart rate of 127, respiratory rate 22, blood pressure 175/84, saturating 92% on room air. Currently her heart rate is 99, blood pressure 101/52, saturating 96% on room air, respiratory  rate is 19.  GENERAL: She is an elderly Caucasian female. She is thin built, slightly wasted in appearance. She is comfortably lying in bed, nontoxic in appearance but appears dry and dehydrated.   HEENT: Bilateral pupils are equal. Extraocular muscles intact. No scleral icterus. No conjunctivitis. Oral mucosa is dry. No pallor.   NECK: No thyroid tenderness, enlargement, or nodule. Neck is supple. No masses, nontender. No adenopathy. No JVD. No carotid bruits.   CHEST: Bilateral breath sounds are clear. No wheeze. Normal effort. No respiratory distress.   HEART: Heart sounds are regular. No murmur. Good peripheral pulses. No lower extremity edema.   ABDOMEN: Soft, nontender. Normal bowel sounds. No hepatosplenomegaly, no bruit, no masses.  RECTAL: Exam as per the ER physician was black stools and guaiac-positive but she is on iron pills.   NEURO: The patient is awake, alert. She is oriented to time, place, and person right now, good insight. Cranial nerves are intact. Moving all extremities against gravity.   EXTREMITIES: No cyanosis. No clubbing.   SKIN: Poor skin turgor.   LABORATORY, DIAGNOSTIC, AND RADIOLOGICAL DATA: She has a white count of 3.2, hemoglobin 13.5, and platelet count of 55,000. Her last platelet count was 143,000 in 10/2011.  Hemoglobin was 9.8 in November. Most likely she is hemoconcentrated at this time. BMP: Sodium 145, potassium 2.7, BUN 25, creatinine 1.11, bicarbonate 20. AST is 54. CK 373. Troponin 0.11. Her BNP is 2054. Her urinalysis shows that she has 3+ blood, RBCs 14, leukocyte esterase 1+, nitrite positive, 49 WBCs per high-power field. WBC clumps are present. Blood cultures have been sent. We will also send a urine culture. ABG: pH of 7.5 and pCO2 of 26, pO2 62 on room air. She had a CT of the head done which showed some changes atrophy, chronic small vessel ischemic disease, partial opacification of right mastoid air cells concerning for mastoiditis.  Chest  x-ray is negative. Her EKG shows that she has sinus tachycardia at 121 with a left axis deviation. She has some Q waves in anteroseptal leads, but not changed from prior EKGs.   IMPRESSION:  1. Systemic inflammatory response syndrome with fever, tachycardia, leukopenia, possibly secondary to urinary tract infection.  2. She also mastoiditis on the CT scan.  3. Dehydration. 4. Hypernatremia. 5. Hypokalemia.  6. Elevated troponins are most likely secondary to tachycardia.  7. Coronary artery disease with previous coronary artery bypass graft. 8. Restless leg syndrome. 9. Thrombocytopenia.  PLAN: 81 year old female with history of coronary artery disease, previous coronary artery bypass grafting. She has some restless leg syndrome, thyroid disease. She was last here in November 2012 when she had a left hip fracture. Today she was sent from [facility] because she slipped from her wheelchair. She was found to be febrile here with a temperature of 102.8. She was tachycardic and she was dehydrated. She is hemoconcentrated right now with a hemoglobin of 13.5. Her baseline hemoglobin was 9.8 in November. She has sodium  on the high side at 145, BUN as high as 25 and her creatinine is 1.1. I will give her D5 half normal saline with potassium. I am also going to give her 40 mEq of IV potassium. She has systemic inflammatory response syndrome as evidenced by fever, leukopenia, and tachycardia. Blood cultures have been sent. We will also send a urine culture. She has some evidence of urinary tract infection on the urinalysis so I am going to start her on ceftriaxone. She is also thrombocytopenic at this time. I am going to hold her aspirin at this time. We will monitor her platelet count. Also, as per the ER physician she has guaiac positive stools. We just need to monitor her hemoglobin. This could be secondary to thrombocytopenia also. Her elevated troponins are most likely secondary to tachycardia. She is not  having any chest pain right now.  We will get a cardiology consult.  I am not ordering a GI consult right now with guaiac positive stool; this could be secondary to thrombocytopenia. She is 81 years old. I will continue PPI, but if her hemoglobin is dropping then GI may need to be involved.          TIME SPENT ON ADMISSION AND COORDINATION OF CARE: 55 minutes.   ____________________________ Fredia Sorrow, MD ag:bjt D: 06/04/2012 21:11:18 ET T: 06/05/2012 06:42:05 ET JOB#: [id number]  cc: Fredia Sorrow, MD, <Dictator> Reola Mosher. Randa Lynn, MD Fredia Sorrow MD ELECTRONICALLY SIGNED 06/12/2012 12:56

## 2015-04-19 NOTE — Discharge Summary (Signed)
PATIENT NAME:  Mia Solomon, Mia Solomon DATE OF BIRTH:  August 06, 1918  DATE OF ADMISSION:  06/04/2012 DATE OF DISCHARGE:  06/07/2012  ADMISSION DIAGNOSIS: SIRS.   DISCHARGE DIAGNOSES: 1. Sepsis with E. coli bacteremia.  2. Hypokalemia.  3. E. coli urinary tract infection. 4. Thrombocytopenia.  5. Elevated troponin secondary to supply/demand ischemia.  6. Hypertension.  7. Hypothyroidism.   CONSULTS: Dr. Harold HedgeKenneth Fath   LABORATORY DATA: Discharge sodium 143, potassium 4.5, chloride 112, bicarbonate 25, BUN 21, creatinine 0.99, glucose 90, white blood cells 9.2, hemoglobin 12, hematocrit 35.2, platelets 50. Blood cultures and urine culture positive for Escherichia coli, pansensitive.   HOSPITAL COURSE: 81 year old female who presented from assisted living with fevers, found to have SIRS and actually sepsis. 1. Escherichia coli sepsis with bacteremia: The patient was afebrile throughout her hospitalization. She was started on Rocephin. I suspect her bacteremia source is secondary to her urinary tract infection. It was positive for Escherichia coli, which was pansensitive. The patient will be changed from Rocephin to Levaquin at discharge.  2. Hypokalemia: Which was repleted.  3. Escherichia coli: As stated above, the patient will be discharged with Levaquin. This is a Biochemist, clinicalpansensitive Escherichia coli.  4. Thrombocytopenia likely from sepsis:  She had low platelets in the past. No evidence of bleeding. Her B12 level was normal.  5. Elevated troponin secondary to supply/demand ischemia, tachycardia, dehydration, underlying coronary artery disease: No further work-up. Appreciate cardiology consult.  Her aspirin was initially held for low platelets.  6. Hypertension: Continue metoprolol.  7. Hypothyroidism: Continue Synthroid.   DISCHARGE MEDICATIONS:  1. Aspirin 81 mg daily.  2. Synthroid 150 mcg daily.  3. Vitamin D3 1000 international units daily.  4. Timolol eyedrops at 8:00  a.m. 5. Pantoprazole 40 mg daily.  6. Fergon 1 tablet b.i.d.  7. Xalatan eyedrops one drop to each eye daily.  8. Tramadol 50 mg every six hours p.r.n. pain.  9. Requip 0.5 mg q. 8 hours p.r.n.  10. Requip 0.5 mg at bedtime.  11. Oxycodone 5 mg q. 4 hours p.r.n.  12. Levaquin 500 mg daily for 10 days.   DISCHARGE DIET: Low sodium.   DISCHARGE ACTIVITY: As tolerated.   DISCHARGE REFERRAL: Physical therapy, Home Health.   DISCHARGE FOLLOWUP:  Follow up with Dr. Wonda ChengJoel Moffett in two weeks.  TIME SPENT: 35 minutes.  ____________________________ Mia ContesSital P. Juliene PinaMody, MD spm:bjt D: 06/07/2012 12:58:31 ET T: 06/07/2012 13:20:58 ET JOB#: [id number]  cc: Chancey Ringel P. Juliene PinaMody, MD, <Dictator> Mia MallardJoel B. Marguerite OleaMoffett, MD Mia PesaSITAL P Navika Hoopes MD ELECTRONICALLY SIGNED 06/08/2012 13:34

## 2015-05-11 ENCOUNTER — Emergency Department
Admission: EM | Admit: 2015-05-11 | Discharge: 2015-05-12 | Disposition: A | Discharge status: Home / Self Care | Payer: Medicare Other | Attending: Emergency Medicine | Admitting: Emergency Medicine

## 2015-05-11 ENCOUNTER — Emergency Department: Payer: Medicare Other

## 2015-05-11 ENCOUNTER — Encounter: Payer: Self-pay | Admitting: Emergency Medicine

## 2015-05-11 DIAGNOSIS — J40 Bronchitis, not specified as acute or chronic: Secondary | ICD-10-CM | POA: Insufficient documentation

## 2015-05-11 DIAGNOSIS — Z79899 Other long term (current) drug therapy: Secondary | ICD-10-CM | POA: Insufficient documentation

## 2015-05-11 DIAGNOSIS — R1030 Lower abdominal pain, unspecified: Secondary | ICD-10-CM | POA: Insufficient documentation

## 2015-05-11 DIAGNOSIS — N3000 Acute cystitis without hematuria: Secondary | ICD-10-CM

## 2015-05-11 DIAGNOSIS — R300 Dysuria: Secondary | ICD-10-CM | POA: Diagnosis present

## 2015-05-11 HISTORY — DX: Heart failure, unspecified: I50.9

## 2015-05-11 HISTORY — DX: Chronic obstructive pulmonary disease, unspecified: J44.9

## 2015-05-11 HISTORY — DX: Unspecified osteoarthritis, unspecified site: M19.90

## 2015-05-11 LAB — BASIC METABOLIC PANEL
ANION GAP: 7 (ref 5–15)
BUN: 18 mg/dL (ref 6–20)
CHLORIDE: 110 mmol/L (ref 101–111)
CO2: 26 mmol/L (ref 22–32)
CREATININE: 0.68 mg/dL (ref 0.44–1.00)
Calcium: 8.5 mg/dL — ABNORMAL LOW (ref 8.9–10.3)
GFR calc Af Amer: >60 mL/min (ref >60)
GFR calc non Af Amer: >60 mL/min (ref >60)
Glucose, Bld: 108 mg/dL — ABNORMAL HIGH (ref 65–99)
Potassium: 3 mmol/L — ABNORMAL LOW (ref 3.5–5.1)
Sodium: 143 mmol/L (ref 135–145)

## 2015-05-11 LAB — CBC
HCT: 33.3 % — ABNORMAL LOW (ref 35.0–47.0)
Hemoglobin: 11.2 g/dL — ABNORMAL LOW (ref 12.0–16.0)
MCH: 29.6 pg (ref 26.0–34.0)
MCHC: 33.7 g/dL (ref 32.0–36.0)
MCV: 87.8 fL (ref 80.0–100.0)
PLATELETS: 93 10*3/uL — AB (ref 150–440)
RBC: 3.8 MIL/uL (ref 3.80–5.20)
RDW: 18.5 % — ABNORMAL HIGH (ref 11.5–14.5)
WBC: 9 10*3/uL (ref 3.6–11.0)

## 2015-05-11 NOTE — ED Provider Notes (Signed)
[facility] Emergency Department Provider Note  ____________________________________________  Time seen: Approximately 11:38 PM  I have reviewed the triage vital signs and the nursing notes.   HISTORY  Chief Complaint Cough and Dysuria    HPI Mia Solomon is a 81 y.o. female comes in to the hospital this evening with a rattling in her chest and pain and pressure in her abdomen when she urinates. The patient and her caregiver are concerned about possible fluid in her lungs. The patient's caregiver reports that she's had this rattle in her chest for the past 3 days. The patient reports that she coughs all the time and it is been productive of yellow phlegm. The patient has had no fever when she lays down and feels short of breath. The patient denies any chest pain but has had some lower abdominal pain. There reports that this pain in her abdomen has also been going on for approximately 2-3 days. The patient's caregiver reports that she's been complaining of pain when she urinates in her abdomen. The family member reports that she does use depends but has had an odor to her urine every time they've changed her. The patient does not have any nausea or vomiting but has some dizziness.   Past Medical History  Diagnosis Date  . Arthritis   . COPD (chronic obstructive pulmonary disease)   . CHF (congestive heart failure)     There are no active problems to display for this patient.   Past Surgical History  Procedure Laterality Date  . Triple bypass    . Congested heart failure      Current Outpatient Rx  Name  Route  Sig  Dispense  Refill  . furosemide (LASIX) 20 MG tablet   Oral   Take 20 mg by mouth.         . metoprolol succinate (TOPROL-XL) 25 MG 24 hr tablet   Oral   Take 25 mg by mouth daily.         Marland Kitchen. levofloxacin (LEVAQUIN) 500 MG tablet   Oral   Take 1 tablet (500 mg total) by mouth daily.   10 tablet   0     Allergies Review of  patient's allergies indicates no known allergies.  History reviewed. No pertinent family history.  Social History History  Substance Use Topics  . Smoking status: Never Smoker   . Smokeless tobacco: Not on file  . Alcohol Use: No    Review of Systems Constitutional: No fever/chills Eyes: No visual changes. ENT: No sore throat. Cardiovascular: Denies chest pain. Respiratory: Cough and shortness of breath. Gastrointestinal: abdominal pain.  No nausea, no vomiting.   Genitourinary: dysuria. Musculoskeletal: Negative for back pain. Skin: Negative for rash. Neurological: Dizziness Hematological/Lymphatic:No leg swelling  10-point ROS otherwise negative.  ____________________________________________   PHYSICAL EXAM:  VITAL SIGNS: ED Triage Vitals  Enc Vitals Group     BP 05/11/15 1935 136/54 mmHg     Pulse Rate 05/11/15 1935 115     Resp 05/11/15 2345 19     Temp 05/11/15 1935 98.7 F (37.1 C)     Temp Source 05/11/15 1935 Oral     SpO2 05/11/15 1935 97 %     Weight 05/11/15 1935 138 lb (62.596 kg)     Height 05/11/15 1935 5' (1.524 m)     Head Cir --      Peak Flow --      Pain Score 05/11/15 1956 2     Pain  Loc --      Pain Edu? --      Excl. in GC? --     Constitutional: Alert and oriented. Well appearing and in no acute distress. Eyes: Conjunctivae are normal. PERRL. EOMI. Head: Atraumatic. Nose: No congestion/rhinnorhea. Mouth/Throat: Mucous membranes are dry.  Oropharynx non-erythematous. Cardiovascular: Normal rate, regular rhythm. Grossly normal heart sounds.  Good peripheral circulation. Respiratory: Normal respiratory effort.  No retractions. Lungs CTAB. Gastrointestinal: Soft and mildly tender in her lower abdomen.. Mild abdominal distention with positive bowel sounds Genitourinary: Deferred Musculoskeletal: No lower extremity tenderness nor edema.   Neurologic:  Normal speech and language. No gross focal neurologic deficits are appreciated. Skin:   Skin is warm, dry and intact. No rash noted. Psychiatric: Mood and affect are normal. Speech and behavior are normal.  ____________________________________________   LABS (all labs ordered are listed, but only abnormal results are displayed)  Labs Reviewed  CBC - Abnormal; Notable for the following:    Hemoglobin 11.2 (*)    HCT 33.3 (*)    RDW 18.5 (*)    Platelets 93 (*)    All other components within normal limits  BASIC METABOLIC PANEL - Abnormal; Notable for the following:    Potassium 3.0 (*)    Glucose, Bld 108 (*)    Calcium 8.5 (*)    All other components within normal limits  URINALYSIS COMPLETEWITH MICROSCOPIC ([facility])  - Abnormal; Notable for the following:    Color, Urine YELLOW (*)    APPearance HAZY (*)    Nitrite POSITIVE (*)    Bacteria, UA MANY (*)    All other components within normal limits  URINE CULTURE   ____________________________________________  EKG  ED ECG REPORT   Date: 05/11/2015  EKG Time: 1950  Rate: 1:15  Rhythm: unchanged from previous tracings, sinus tachycardia, LBBB, left axis deviation  Axis: Asked axis deviation  Intervals:left bundle branch block  ST&T Change: Consistent with left axis deviation  ____________________________________________  RADIOLOGY  Chest x-ray: Stable chest x-ray without evidence of acute cardiopulmonary process, stable cardiomegaly, aortic atherosclerosis, chronic bronchitic change and bibasilar scarring versus atelectasis  CT abdomen and pelvis: Portal hypertension with perisplenic varices and spleen or renal shunt, question gallstones small multilobulated cystic lesion in the body of the pancreas, distal colonic diverticulosis without diverticulitis, dense atherosclerosis of the abdominal aorta, left renal cyst, chronic lung disease ____________________________________________   PROCEDURES  Procedure(s) performed: None  Critical Care performed:  No  ____________________________________________   INITIAL IMPRESSION / ASSESSMENT AND PLAN / ED COURSE  Pertinent labs & imaging results that were available during my care of the patient were reviewed by me and considered in my medical decision making (see chart for details).  This is a 81 year old female who comes in with some chest rattling as well as some dysuria and lower abdominal pain. The patient's blood work at this time appears unremarkable and the patient's chest x-ray does not show any signs of pneumonia or pulmonary edema. I will await the results of the urinalysis and image the patient's abdomen and she does have some distention and lower abdominal pain. The patient did receive a dose of Suprax.  Given the patient's cough I will treat the patient with levofloxacin for urinary tract infection and bronchitis. The patient will be discharged home to follow-up with her primary care physician. I discussed this with the patient's caregiver and they do agree with the plan as stated ____________________________________________   FINAL CLINICAL IMPRESSION(S) / ED DIAGNOSES  Final  diagnoses:  Acute cystitis without hematuria  Bronchitis      Rebecka ApleyAllison P Webster, MD 05/12/15 [phone]

## 2015-05-11 NOTE — ED Notes (Signed)
Patient's caregiver states that patient has been c/o burning when she voids and lower abdominal pain for several days. Caregiver also reports a "rattling" cough. Patient is non-ambulatory at baseline.

## 2015-05-12 ENCOUNTER — Emergency Department: Payer: Medicare Other

## 2015-05-12 ENCOUNTER — Other Ambulatory Visit: Payer: Self-pay

## 2015-05-12 LAB — URINALYSIS COMPLETE WITH MICROSCOPIC (ARMC ONLY)
BILIRUBIN URINE: NEGATIVE
GLUCOSE, UA: NEGATIVE mg/dL
Hgb urine dipstick: NEGATIVE
Ketones, ur: NEGATIVE mg/dL
Leukocytes, UA: NEGATIVE
Nitrite: POSITIVE — AB
Protein, ur: NEGATIVE mg/dL
SPECIFIC GRAVITY, URINE: 1.01 (ref 1.005–1.030)
Squamous Epithelial / LPF: NONE SEEN
pH: 6 (ref 5.0–8.0)

## 2015-05-12 MED ORDER — LEVOFLOXACIN 500 MG PO TABS
ORAL_TABLET | ORAL | Status: AC
  Administered 2015-05-12: 500 mg via ORAL
  Filled 2015-05-12: qty 1

## 2015-05-12 MED ORDER — CEFIXIME 400 MG PO TABS
400.0000 mg | ORAL_TABLET | Freq: Once | ORAL | Status: AC
  Administered 2015-05-12: 400 mg via ORAL

## 2015-05-12 MED ORDER — LEVOFLOXACIN 500 MG PO TABS
500.0000 mg | ORAL_TABLET | Freq: Once | ORAL | Status: AC
  Administered 2015-05-12: 500 mg via ORAL

## 2015-05-12 MED ORDER — IOHEXOL 240 MG/ML SOLN
25.0000 mL | Freq: Once | INTRAMUSCULAR | Status: AC | PRN
  Administered 2015-05-12: 25 mL via ORAL

## 2015-05-12 MED ORDER — LEVOFLOXACIN 500 MG PO TABS: 500.0000 mg | ORAL_TABLET | Freq: Every day | ORAL | Status: AC

## 2015-05-12 MED ORDER — IOHEXOL 300 MG/ML  SOLN
100.0000 mL | Freq: Once | INTRAMUSCULAR | Status: AC | PRN
  Administered 2015-05-12: 100 mL via INTRAVENOUS

## 2015-05-12 MED ORDER — CEFIXIME 400 MG PO TABS
ORAL_TABLET | ORAL | Status: AC
  Administered 2015-05-12: 400 mg via ORAL
  Filled 2015-05-12: qty 1

## 2015-05-12 NOTE — ED Notes (Signed)
Pt to CT

## 2015-05-12 NOTE — ED Provider Notes (Signed)
Upon reviewing the patient's previous EKGs there was not an EKG with left bundle branch block. So I did repeat the patient's EKG as she did not have any chest pain or shortness of breath.  ED ECG REPORT   Date: 05/12/2015  EKG Time: 301  Rate: 99  Rhythm: normal sinus rhythm, left axis deviation  Axis: Left axis deviation  Intervals:none  ST&T Change: Flipped T waves in leads V2 and aVL  The patient's EKG is improved as she has no longer tachycardic. I will so discharge the patient home to follow-up with her primary care doctor.   Mia ApleyAllison P Artin Mceuen, MD 05/12/15 [phone]

## 2015-05-12 NOTE — ED Notes (Signed)
Pt's caregiver and pt verbalize understanding of discharge instructions.

## 2015-05-12 NOTE — Discharge Instructions (Signed)
°  Urinary Tract Infection A urinary tract infection (UTI) can occur any place along the urinary tract. The tract includes the kidneys, ureters, bladder, and urethra. A type of germ called bacteria often causes a UTI. UTIs are often helped with antibiotic medicine.  HOME CARE   If given, take antibiotics as told by your doctor. Finish them even if you start to feel better.  Drink enough fluids to keep your pee (urine) clear or pale yellow.  Avoid tea, drinks with caffeine, and bubbly (carbonated) drinks.  Pee often. Avoid holding your pee in for a long time.  Pee before and after having sex (intercourse).  Wipe from front to back after you poop (bowel movement) if you are a woman. Use each tissue only once. GET HELP RIGHT AWAY IF:   You have back pain.  You have lower belly (abdominal) pain.  You have chills.  You feel sick to your stomach (nauseous).  You throw up (vomit).  Your burning or discomfort with peeing does not go away.  You have a fever.  Your symptoms are not better in 3 days. MAKE SURE YOU:   Understand these instructions.  Will watch your condition.  Will get help right away if you are not doing well or get worse. Document Released: 05/30/2008 Document Revised: 09/05/2012 Document Reviewed: 07/12/2012 [facility] Patient Information 2015 [place], [place]. This information is not intended to replace advice given to you by your health care provider. Make sure you discuss any questions you have with your health care provider.  Upper Respiratory Infection, Adult An upper respiratory infection (URI) is also known as the common cold. It is often caused by a type of germ (virus). Colds are easily spread (contagious). You can pass it to others by kissing, coughing, sneezing, or drinking out of the same glass. Usually, you get better in 1 or 2 weeks.  HOME CARE   Only take medicine as told by your doctor.  Use a warm mist humidifier or breathe in steam from a hot  shower.  Drink enough water and fluids to keep your pee (urine) clear or pale yellow.  Get plenty of rest.  Return to work when your temperature is back to normal or as told by your doctor. You may use a face mask and wash your hands to stop your cold from spreading. GET HELP RIGHT AWAY IF:   After the first few days, you feel you are getting worse.  You have questions about your medicine.  You have chills, shortness of breath, or brown or red spit (mucus).  You have yellow or brown snot (nasal discharge) or pain in the face, especially when you bend forward.  You have a fever, puffy (swollen) neck, pain when you swallow, or white spots in the back of your throat.  You have a bad headache, ear pain, sinus pain, or chest pain.  You have a high-pitched whistling sound when you breathe in and out (wheezing).  You have a lasting cough or cough up blood.  You have sore muscles or a stiff neck. MAKE SURE YOU:   Understand these instructions.  Will watch your condition.  Will get help right away if you are not doing well or get worse. Document Released: 05/30/2008 Document Revised: 03/05/2012 Document Reviewed: 03/19/2014 [facility] Patient Information 2015 [place], [place]. This information is not intended to replace advice given to you by your health care provider. Make sure you discuss any questions you have with your health care provider.

## 2015-05-14 LAB — URINE CULTURE
Culture: >=100000
SPECIAL REQUESTS: NORMAL

## 2015-07-04 ENCOUNTER — Encounter: Payer: Self-pay | Admitting: Emergency Medicine

## 2015-07-04 ENCOUNTER — Other Ambulatory Visit: Payer: Self-pay

## 2015-07-04 ENCOUNTER — Emergency Department
Admission: EM | Admit: 2015-07-04 | Discharge: 2015-07-06 | Disposition: A | Discharge status: Skilled Nursing Facility | Payer: Medicare Other | Attending: Emergency Medicine | Admitting: Emergency Medicine

## 2015-07-04 ENCOUNTER — Emergency Department: Payer: Medicare Other

## 2015-07-04 DIAGNOSIS — F0390 Unspecified dementia without behavioral disturbance: Secondary | ICD-10-CM | POA: Diagnosis not present

## 2015-07-04 DIAGNOSIS — Z79899 Other long term (current) drug therapy: Secondary | ICD-10-CM | POA: Insufficient documentation

## 2015-07-04 DIAGNOSIS — N390 Urinary tract infection, site not specified: Secondary | ICD-10-CM | POA: Diagnosis not present

## 2015-07-04 DIAGNOSIS — Z008 Encounter for other general examination: Secondary | ICD-10-CM | POA: Diagnosis present

## 2015-07-04 LAB — CBC WITH DIFFERENTIAL/PLATELET
BASOS ABS: 0.1 10*3/uL (ref 0–0.1)
Basophils Relative: 1 %
Eosinophils Absolute: 0.3 10*3/uL (ref 0–0.7)
HCT: 34.7 % — ABNORMAL LOW (ref 35.0–47.0)
Hemoglobin: 11.6 g/dL — ABNORMAL LOW (ref 12.0–16.0)
Lymphocytes Relative: 20 %
Lymphs Abs: 1.4 10*3/uL (ref 1.0–3.6)
MCH: 29.7 pg (ref 26.0–34.0)
MCHC: 33.3 g/dL (ref 32.0–36.0)
MCV: 89.2 fL (ref 80.0–100.0)
Monocytes Absolute: 0.7 10*3/uL (ref 0.2–0.9)
Monocytes Relative: 11 %
NEUTROS ABS: 4.3 10*3/uL (ref 1.4–6.5)
Neutrophils Relative %: 63 %
PLATELETS: 68 10*3/uL — AB (ref 150–440)
RBC: 3.89 MIL/uL (ref 3.80–5.20)
RDW: 20.8 % — AB (ref 11.5–14.5)
WBC: 6.7 10*3/uL (ref 3.6–11.0)

## 2015-07-04 LAB — URINE DRUG SCREEN, QUALITATIVE (ARMC ONLY)
AMPHETAMINES, UR SCREEN: NOT DETECTED
BARBITURATES, UR SCREEN: NOT DETECTED
Benzodiazepine, Ur Scrn: NOT DETECTED
CANNABINOID 50 NG, UR ~~LOC~~: NOT DETECTED
Cocaine Metabolite,Ur ~~LOC~~: NOT DETECTED
MDMA (ECSTASY) UR SCREEN: NOT DETECTED
Methadone Scn, Ur: NOT DETECTED
Opiate, Ur Screen: NOT DETECTED
PHENCYCLIDINE (PCP) UR S: NOT DETECTED
TRICYCLIC, UR SCREEN: NOT DETECTED

## 2015-07-04 LAB — URINALYSIS COMPLETE WITH MICROSCOPIC (ARMC ONLY)
Bilirubin Urine: NEGATIVE
Glucose, UA: NEGATIVE mg/dL
Ketones, ur: NEGATIVE mg/dL
NITRITE: POSITIVE — AB
PROTEIN: NEGATIVE mg/dL
Specific Gravity, Urine: 1.011 (ref 1.005–1.030)
pH: 7 (ref 5.0–8.0)

## 2015-07-04 LAB — COMPREHENSIVE METABOLIC PANEL
ALBUMIN: 3.2 g/dL — AB (ref 3.5–5.0)
ALT: 15 U/L (ref 14–54)
AST: 35 U/L (ref 15–41)
Alkaline Phosphatase: 52 U/L (ref 38–126)
Anion gap: 4 — ABNORMAL LOW (ref 5–15)
BUN: 22 mg/dL — AB (ref 6–20)
CO2: 27 mmol/L (ref 22–32)
CREATININE: 0.88 mg/dL (ref 0.44–1.00)
Calcium: 8.4 mg/dL — ABNORMAL LOW (ref 8.9–10.3)
Chloride: 113 mmol/L — ABNORMAL HIGH (ref 101–111)
GFR calc Af Amer: >60 mL/min (ref >60)
GFR calc non Af Amer: 54 mL/min — ABNORMAL LOW (ref >60)
GLUCOSE: 155 mg/dL — AB (ref 65–99)
Potassium: 3.7 mmol/L (ref 3.5–5.1)
Sodium: 144 mmol/L (ref 135–145)
TOTAL PROTEIN: 5.6 g/dL — AB (ref 6.5–8.1)
Total Bilirubin: 1.2 mg/dL (ref 0.3–1.2)

## 2015-07-04 LAB — SALICYLATE LEVEL

## 2015-07-04 LAB — ACETAMINOPHEN LEVEL: Acetaminophen (Tylenol), Serum: <10 ug/mL — ABNORMAL LOW (ref 10–30)

## 2015-07-04 LAB — TROPONIN I: Troponin I: 0.04 ng/mL — ABNORMAL HIGH (ref <0.031)

## 2015-07-04 LAB — LIPASE, BLOOD: LIPASE: 36 U/L (ref 22–51)

## 2015-07-04 LAB — ETHANOL: Alcohol, Ethyl (B): <5 mg/dL (ref <5)

## 2015-07-04 LAB — MAGNESIUM: MAGNESIUM: 1.8 mg/dL (ref 1.7–2.4)

## 2015-07-04 MED ORDER — CEFTRIAXONE SODIUM 1 G IJ SOLR
1.0000 g | Freq: Once | INTRAMUSCULAR | Status: AC
  Administered 2015-07-04: 1 g via INTRAMUSCULAR

## 2015-07-04 MED ORDER — LIDOCAINE HCL (PF) 1 % IJ SOLN
INTRAMUSCULAR | Status: AC
  Administered 2015-07-04: 2.1 mL
  Filled 2015-07-04: qty 5

## 2015-07-04 MED ORDER — CEFTRIAXONE SODIUM 1 G IJ SOLR
INTRAMUSCULAR | Status: AC
  Administered 2015-07-04: 1 g via INTRAMUSCULAR
  Filled 2015-07-04: qty 10

## 2015-07-04 MED ORDER — CEPHALEXIN 500 MG PO CAPS
500.0000 mg | ORAL_CAPSULE | Freq: Two times a day (BID) | ORAL | Status: DC
  Administered 2015-07-05 – 2015-07-06 (×3): 500 mg via ORAL
  Filled 2015-07-04: qty 1

## 2015-07-04 NOTE — ED Notes (Signed)
elevated TROPONIN: 0.04, Dr York CeriseForbach
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
## 2015-07-05 LAB — TROPONIN I: Troponin I: 0.04 ng/mL — ABNORMAL HIGH (ref <0.031)

## 2015-07-05 MED ORDER — DIPHENHYDRAMINE HCL 25 MG PO CAPS
ORAL_CAPSULE | ORAL | Status: AC
  Administered 2015-07-05: 25 mg via ORAL
  Filled 2015-07-05: qty 1

## 2015-07-05 MED ORDER — DIPHENHYDRAMINE HCL 25 MG PO CAPS
25.0000 mg | ORAL_CAPSULE | Freq: Once | ORAL | Status: AC
  Administered 2015-07-05: 25 mg via ORAL

## 2015-07-05 MED ORDER — DONEPEZIL HCL 5 MG PO TABS
ORAL_TABLET | ORAL | Status: AC
  Administered 2015-07-05: 5 mg via ORAL
  Filled 2015-07-05: qty 1

## 2015-07-05 MED ORDER — MEMANTINE HCL ER 7 MG PO CP24
7.0000 mg | ORAL_CAPSULE | Freq: Every day | ORAL | Status: DC
  Administered 2015-07-05 – 2015-07-06 (×2): 7 mg via ORAL
  Filled 2015-07-05 (×2): qty 1

## 2015-07-05 MED ORDER — CEPHALEXIN 500 MG PO CAPS
ORAL_CAPSULE | ORAL | Status: AC
  Filled 2015-07-05: qty 1

## 2015-07-05 MED ORDER — CITALOPRAM HYDROBROMIDE 20 MG PO TABS
10.0000 mg | ORAL_TABLET | Freq: Every day | ORAL | Status: DC
Start: 2015-07-05 — End: 2015-07-06
  Administered 2015-07-05 – 2015-07-06 (×2): 10 mg via ORAL
  Filled 2015-07-05: qty 1

## 2015-07-05 MED ORDER — LIDOCAINE HCL (PF) 1 % IJ SOLN
2.1000 mL | Freq: Once | INTRAMUSCULAR | Status: AC
  Administered 2015-07-04: 2.1 mL

## 2015-07-05 MED ORDER — CEPHALEXIN 500 MG PO CAPS
ORAL_CAPSULE | ORAL | Status: AC
  Administered 2015-07-05: 500 mg via ORAL
  Filled 2015-07-05: qty 1

## 2015-07-05 MED ORDER — DONEPEZIL HCL 5 MG PO TABS
5.0000 mg | ORAL_TABLET | Freq: Two times a day (BID) | ORAL | Status: DC
  Administered 2015-07-05 – 2015-07-06 (×2): 5 mg via ORAL
  Filled 2015-07-05: qty 1

## 2015-07-05 MED ORDER — CITALOPRAM HYDROBROMIDE 20 MG PO TABS
ORAL_TABLET | ORAL | Status: AC
  Administered 2015-07-05: 10 mg via ORAL
  Filled 2015-07-05: qty 1
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
## 2015-07-05 NOTE — ED Notes (Signed)
BEHAVIORAL HEALTH ROUNDING Patient sleeping: No. Patient alert and oriented: not applicable Behavior appropriate: Yes.  ; If no, describe:  Nutrition and fluids offered: Yes  Toileting and hygiene offered: Yes  Sitter present: not applicable Law enforcement present: Yes  

## 2015-07-05 NOTE — ED Notes (Signed)
BEHAVIORAL HEALTH ROUNDING Patient sleeping: Yes.   Patient alert and oriented: not applicable Behavior appropriate: Yes.  ; If no, describe:  Nutrition and fluids offered: No Toileting and hygiene offered: No Sitter present: not applicable Law enforcement present: Yes  
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
## 2015-07-05 NOTE — ED Notes (Signed)
Pt resting quietly.

## 2015-07-05 NOTE — ED Notes (Signed)
Extended conversation between RN and Mia GreenhouseSusanna Solomon [phone]), granddaughter of pt.  Pt's son, now deceased, is Ms. Mia CaraGilbert's father.  Ms. Mia BiGilbert/[place], has scheduled a visit at the end of July with pt.  Ms. Mia LoneGilbert shares POA with Mia PihJackie, her aunt and daughter of pt.  Ms. Mia LoneGilbert upset over condition of her grandmother.  Pt has two daughters in the area who have not seen their mother in four years, per Ms. Mia LoneGilbert.

## 2015-07-05 NOTE — ED Notes (Signed)
ED BHU PLACEMENT JUSTIFICATION Is the patient under IVC or is there intent for IVC: Yes.   Is the patient medically cleared: No. Is there vacancy in the ED BHU: Yes.   Is the population mix appropriate for patient: No. Is the patient awaiting placement in inpatient or outpatient setting: Yes.   Has the patient had a psychiatric consult: No. Survey of unit performed for contraband, proper placement and condition of furniture, tampering with fixtures in bathroom, shower, and each patient room: Yes.  ; Findings:  APPEARANCE/BEHAVIOR calm and cooperative NEURO ASSESSMENT Orientation: person Hallucinations: No.None noted (Hallucinations) Speech: Slurred Gait: shuffle RESPIRATORY ASSESSMENT Normal expansion.  Clear to auscultation.  No rales, rhonchi, or wheezing. CARDIOVASCULAR ASSESSMENT regular rate and rhythm, S1, S2 normal, no murmur, click, rub or gallop GASTROINTESTINAL ASSESSMENT soft, nontender, BS WNL, no r/g EXTREMITIES gait was normal for age PLAN OF CARE Provide calm/safe environment. Vital signs assessed twice daily. ED BHU Assessment once each 12-hour shift. Collaborate with intake RN daily or as condition indicates. Assure the ED provider has rounded once each shift. Provide and encourage hygiene. Provide redirection as needed. Assess for escalating behavior; address immediately and inform ED provider.  Assess family dynamic and appropriateness for visitation as needed: Yes.  ; If necessary, describe findings:  Educate the patient/family about BHU procedures/visitation: Yes.  ; If necessary, describe findings:

## 2015-07-05 NOTE — ED Notes (Signed)
BEHAVIORAL HEALTH ROUNDING  Patient sleeping: No.  Patient alert and oriented: yes  Behavior appropriate: Yes. ; If no, describe:  Nutrition and fluids offered: Yes  Toileting and hygiene offered: Yes  Sitter present: not applicable  Law enforcement present: Yes ODS  

## 2015-07-05 NOTE — ED Notes (Signed)
SANE RN Traci called for consult after physical exam of pt.  Dr York CeriseForbach notified of petechiae like marking in upper labial folds.  Marked erythematous area around genitalia and perineal with heavy urine odor. "scuffing" type abrasions on right lower gluteal cheek.

## 2015-07-05 NOTE — ED Notes (Signed)
Computer did not allow sign on.  IT called.

## 2015-07-05 NOTE — ED Notes (Signed)
BEHAVIORAL HEALTH ROUNDING Patient sleeping: No. Patient alert and oriented: no Behavior appropriate: Yes.  ; If no, describe:  Pt changed out of urine bedding and wet diaper Nutrition and fluids offered: Yes  Toileting and hygiene offered: Yes  Sitter present: no Law enforcement present: Yes  and ODS
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
## 2015-07-05 NOTE — ED Notes (Signed)
BEHAVIORAL HEALTH ROUNDING Patient sleeping: No. Patient alert and oriented: no Behavior appropriate: Yes.  ; If no, describe:   Nutrition and fluids offered: Yes  Toileting and hygiene offered: Yes  Sitter present: no Law enforcement present: Yes  and ODS  

## 2015-07-05 NOTE — ED Notes (Addendum)
89 YOF with dementia.  Speech is very soft and difficult to understand.  Pt has ecchymoses on bilateral shins. Pt is incontinent.  Perineum is erythematous.  Skin surrounding her rectum is a shiny white/pink that is blanchable.  Birthmark on left buttock.  Inside upper labia are several spots that could be described as petechiae or tiny scabs or tiny scratches.  Previous shift MD and RN unable to identify, per report.  Previous shift, the SANE RN was consulted.  Pt refused exam by SANE RN.  Areas located,  assessed, and charted by current RN.  MD informed.

## 2015-07-05 NOTE — ED Notes (Addendum)
BEHAVIORAL HEALTH ROUNDING Patient sleeping: No. Patient alert and oriented: no Behavior appropriate: Yes.  ; If no, describe:   Nutrition and fluids offered: Yes  Toileting and hygiene offered: Yes  Sitter present: no Law enforcement present: Yes  and ODS  ENVIRONMENTAL ASSESSMENT Potentially harmful objects out of patient reach: Yes.   Personal belongings secured: Yes.   Patient dressed in hospital provided attire only: Yes.   Plastic bags out of patient reach: Yes.   Patient care equipment (cords, cables, call bells, lines, and drains) shortened, removed, or accounted for: Yes.   Equipment and supplies removed from bottom of stretcher: Yes.   Potentially toxic materials out of patient reach: Yes.   Sharps container removed or out of patient reach: Yes.    

## 2015-07-05 NOTE — ED Notes (Signed)
Pt OOB to recliner.

## 2015-07-05 NOTE — ED Notes (Signed)
BEHAVIORAL HEALTH ROUNDING Patient sleeping: No. Patient alert and oriented: not applicable Behavior appropriate: Yes.  ; If no, describe:  Nutrition and fluids offered: No Toileting and hygiene offered: No Sitter present: not applicable Law enforcement present: Yes  
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
## 2015-07-05 NOTE — ED Notes (Signed)
Pt in camisole and incontinence wear.

## 2015-07-05 NOTE — ED Notes (Signed)
BEHAVIORAL HEALTH ROUNDING Patient sleeping: Yes.   Patient alert and oriented: not applicable Behavior appropriate: Yes.  ; If no, describe:   Nutrition and fluids offered: No Toileting and hygiene offered: No Sitter present: no Law enforcement present: Yes  and ODS    
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
## 2015-07-05 NOTE — ED Notes (Signed)
Physical therapist is working with pt.

## 2015-07-05 NOTE — ED Notes (Signed)
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
## 2015-07-05 NOTE — ED Notes (Signed)
Annice PihJackie, pt's daughter, arrived for visit with mother.  Annice PihJackie and RN had extended conversation re: condition of pt and possible solutions.  Annice PihJackie is intelligent and willing to assume responsibility for her mother.   RN called SW to meet with Annice PihJackie.  SW has been developing alternatives for pt and will provide information to [place].

## 2015-07-05 NOTE — ED Notes (Signed)
ENVIRONMENTAL ASSESSMENT Potentially harmful objects out of patient reach: Yes.   Personal belongings secured: Yes.   Patient dressed in hospital provided attire only: No. Plastic bags out of patient reach: Yes.   Patient care equipment (cords, cables, call bells, lines, and drains) shortened, removed, or accounted for: Yes.   Equipment and supplies removed from bottom of stretcher: Yes.   Potentially toxic materials out of patient reach: Yes.   Sharps container removed or out of patient reach: Yes.   
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
## 2015-07-05 NOTE — ED Notes (Signed)
Traci arrived, briefed on pt assessment
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
## 2015-07-05 NOTE — ED Notes (Signed)
BEHAVIORAL HEALTH ROUNDING Patient sleeping: Yes.   Patient alert and oriented: not applicable Behavior appropriate: Yes.  ; If no, describe:  Nutrition and fluids offered: Yes  Toileting and hygiene offered: Yes  Sitter present: not applicable Law enforcement present: Yes  

## 2015-07-05 NOTE — ED Notes (Signed)
Pt returned to bed.

## 2015-07-06 LAB — URINE CULTURE
Culture: >=100000
Special Requests: NORMAL

## 2015-07-06 NOTE — ED Notes (Signed)
BEHAVIORAL HEALTH ROUNDING Patient sleeping: No. Patient alert and oriented: yes Behavior appropriate: Yes.  ; If no, describe:  Nutrition and fluids offered: Yes  Toileting and hygiene offered: Yes  Sitter present: yes Law enforcement present: Yes  

## 2015-07-06 NOTE — ED Notes (Signed)
Pt's daughter in to visit with patient.

## 2015-07-06 NOTE — ED Notes (Signed)
BEHAVIORAL HEALTH ROUNDING Patient sleeping: Yes.   Patient alert and oriented: not applicable SLEEPING Behavior appropriate: Yes.  ; If no, describe: SLEEPING Nutrition and fluids offered: No SLEEPING Toileting and hygiene offered: NoSLEEPING Sitter present: not applicable Law enforcement present: Yes ODS 

## 2015-07-06 NOTE — ED Notes (Signed)

## 2015-07-06 NOTE — ED Provider Notes (Signed)
-----------------------------------------   6:20 AM on 07/06/2015 -----------------------------------------   BP 127/43 mmHg  Pulse 68  Temp(Src) 98 F (36.7 C) (Oral)  Resp 16  Ht 5\' 2"  (1.575 m)  Wt 130 lb (58.968 kg)  BMI 23.77 kg/m2  SpO2 95%  The patient had no acute events since last update.  Calm and cooperative at this time.  Disposition is pending per Psychiatry/Behavioral Medicine/SW team recommendations.     Irean HongJade J Gurleen Larrivee, MD 07/06/15 [phone]

## 2015-07-06 NOTE — ED Notes (Signed)
BEHAVIORAL HEALTH ROUNDING Patient sleeping: Yes.   Patient alert and oriented: not applicable Behavior appropriate: Yes.  ; If no, describe:  Nutrition and fluids offered: Yes  Toileting and hygiene offered: Yes  Sitter present: yes Law enforcement present: Yes  

## 2015-07-06 NOTE — Clinical Social Work Placement (Signed)
   CLINICAL SOCIAL WORK PLACEMENT  NOTE  Date:  07/06/2015  Patient Details  Name: Mia Solomon MRN: [id number] Date of Birth: [birth year]  Clinical Social Work is seeking post-discharge placement for this patient at the Skilled  Nursing Facility level of care (*CSW will initial, date and re-position this form in  chart as items are completed):  Yes   Patient/family provided with [facility] Clinical Social Work Department's list of facilities offering this level of care within the geographic area requested by the patient (or if unable, by the patient's family).  Yes   Patient/family informed of their freedom to choose among providers that offer the needed level of care, that participate in Medicare, Medicaid or managed care program needed by the patient, have an available bed and are willing to accept the patient.  Yes   Patient/family informed of [facility]'s ownership interest in [facility] and [facility], as well as of the fact that they are under no obligation to receive care at these facilities.  PASRR submitted to EDS on       PASRR number received on       Existing PASRR number confirmed on 07/05/15     FL2 transmitted to all facilities in geographic area requested by pt/family on 07/05/15     FL2 transmitted to all facilities within larger geographic area on       Patient informed that his/her managed care company has contracts with or will negotiate with certain facilities, including the following:        Yes   Patient/family informed of bed offers received.  Patient chooses bed at       Physician recommends and patient chooses bed at      Patient to be transferred to  [facility] on 07/06/15.  Patient to be transferred to facility by EMS     Patient family notified on 07/06/15 of transfer.  Name of family member notified:  ImmunologistJacqueline Solomon(daughter)     PHYSICIAN Please prepare priority discharge summary, including medications, Please sign FL2      Additional Comment:  CSW spoke with the patient's daughter who has agreed for the patient to be [facility].  CS[facility] to bed 91 and call report to [phone]. The family and facility will coordinate the admission paperwork and upfront fees per [facility].  Patient will be transported by EMS.    _______________________________________________ Mia Solomon, Sukhraj Esquivias A, LCSW 07/06/2015, 3:45 PM

## 2015-07-06 NOTE — ED Notes (Signed)
Daughter and Granddaughter [facility](PO[facility]) asking for pt not to receive phone calls except from those 2; GrenadaBrittany [phone] and Gilmer MorJohn Williams [phone] called and asked to speak to patient, granddaughter called and given phone numbers and info.

## 2015-08-04 ENCOUNTER — Emergency Department
Admission: EM | Admit: 2015-08-04 | Discharge: 2015-08-05 | Disposition: A | Discharge status: Home / Self Care | Payer: Medicare Other | Attending: Emergency Medicine | Admitting: Emergency Medicine

## 2015-08-04 ENCOUNTER — Encounter: Payer: Self-pay | Admitting: Emergency Medicine

## 2015-08-04 ENCOUNTER — Emergency Department: Payer: Medicare Other

## 2015-08-04 DIAGNOSIS — Y9289 Other specified places as the place of occurrence of the external cause: Secondary | ICD-10-CM | POA: Diagnosis not present

## 2015-08-04 DIAGNOSIS — J189 Pneumonia, unspecified organism: Secondary | ICD-10-CM

## 2015-08-04 DIAGNOSIS — Z79899 Other long term (current) drug therapy: Secondary | ICD-10-CM | POA: Insufficient documentation

## 2015-08-04 DIAGNOSIS — Y9389 Activity, other specified: Secondary | ICD-10-CM | POA: Diagnosis not present

## 2015-08-04 DIAGNOSIS — X58XXXA Exposure to other specified factors, initial encounter: Secondary | ICD-10-CM | POA: Diagnosis not present

## 2015-08-04 DIAGNOSIS — Y998 Other external cause status: Secondary | ICD-10-CM | POA: Diagnosis not present

## 2015-08-04 DIAGNOSIS — J159 Unspecified bacterial pneumonia: Secondary | ICD-10-CM | POA: Insufficient documentation

## 2015-08-04 DIAGNOSIS — T7840XA Allergy, unspecified, initial encounter: Secondary | ICD-10-CM | POA: Diagnosis present

## 2015-08-04 HISTORY — DX: Rectal prolapse: K62.3

## 2015-08-04 MED ORDER — IPRATROPIUM-ALBUTEROL 0.5-2.5 (3) MG/3ML IN SOLN
3.0000 mL | Freq: Once | RESPIRATORY_TRACT | Status: AC
  Administered 2015-08-04: 3 mL via RESPIRATORY_TRACT
  Filled 2015-08-04: qty 3

## 2015-08-04 MED ORDER — LEVOFLOXACIN IN D5W 750 MG/150ML IV SOLN
750.0000 mg | Freq: Once | INTRAVENOUS | Status: AC
  Administered 2015-08-05: 750 mg via INTRAVENOUS
  Filled 2015-08-04: qty 150

## 2015-08-04 NOTE — ED Provider Notes (Signed)
[facility] Emergency Department Provider Note  Time seen: 10:38 PM  I have reviewed the triage vital signs and the nursing notes.   HISTORY  Chief Complaint Allergic Reaction    HPI Takenya Travaglini is a 81 y.o. female with a past medical history of COPD, on 2 L home O2 24/7, CHF, arthritis, presents the emergency department for a possible allergic reaction. According to report nurses at the nursing home stated the patient had facial swelling approximately 3 hours ago. She received by mouth Benadryl, prednisone, and a DuoNeb,  the facial swelling resolved.EMS was called to the nursing home for possible allergic reaction and low blood pressure. Her nurse reported they stated the blood pressure was approximately 80 systolic. They transported patient to the emergency department for evaluation. Upon arrival the patient denies any symptoms, denies any shortness of breath, room air saturation 91%, 2 L saturation of approximately 95%. Blood pressure 140 systolic on arrival.     Past Medical History  Diagnosis Date  . Arthritis   . COPD (chronic obstructive pulmonary disease)   . CHF (congestive heart failure)     There are no active problems to display for this patient.   Past Surgical History  Procedure Laterality Date  . Triple bypass    . Congested heart failure      Current Outpatient Rx  Name  Route  Sig  Dispense  Refill  . furosemide (LASIX) 20 MG tablet   Oral   Take 20 mg by mouth.         . metoprolol succinate (TOPROL-XL) 25 MG 24 hr tablet   Oral   Take 25 mg by mouth daily.           Allergies Review of patient's allergies indicates no known allergies.  No family history on file.  Social History History  Substance Use Topics  . Smoking status: Never Smoker   . Smokeless tobacco: Not on file  . Alcohol Use: No    Review of Systems Constitutional: Negative for fever. Cardiovascular: Negative for chest pain. Respiratory:  Negative for shortness of breath. Gastrointestinal: Negative for abdominal pain Neurological: Negative for headache 10-point ROS otherwise negative.  ____________________________________________   PHYSICAL EXAM:  Constitutional: Alert, calm and cooperative, no distress. Well-appearing. Eyes: Normal exam ENT   Mouth/Throat: Mucous membranes are moist. No oral swelling. No facial swelling. Cardiovascular: Normal rate, regular rhythm.  Respiratory: Normal respiratory effort without tachypnea nor retractions. Mild expiratory wheeze bilaterally. No rales or rhonchi. Gastrointestinal: Soft and nontender. No distention.  Musculoskeletal: Nontender with normal range of motion in all extremities.  Neurologic:  Normal speech and language. No gross focal neurologic deficits Skin:  Skin is warm, dry and intact.  Psychiatric: Mood and affect are normal. Speech and behavior are normal.  ____________________________________________     RADIOLOGY  Chest x-ray consistent right lower lobe pneumonia.  ____________________________________________    INITIAL IMPRESSION / ASSESSMENT AND PLAN / ED COURSE  Pertinent labs & imaging results that were available during my care of the patient were reviewed by me and considered in my medical decision making (see chart for details).  Patient presents for possible allergic reaction. According to EMS the patient ate a hot dog around 7:30 to 8 PM tonight. Within 20-30 minutes developed some facial and tongue swelling per the nurse at the nursing home. Patient was given 50 mg of by mouth Benadryl, 60 mg of prednisone, and a DuoNeb. They were unable to reach the supervising physician until  just prior to the patient's arrival when they obtain permission to send the patient to the emergency department for evaluation. Upon arrival the patient has no symptoms, denies any trouble breathing, has no swelling noted, no stridor, minimal wheeze and the patient has a  history of COPD at baseline. She has a good oxygen saturation on 2 L by nasal cannula which is the patient's baseline. Hip in the minimal wheeze we will check a chest x-ray, treat with a DuoNeb, and monitor closely in the emergency department. Currently the patient has a normal blood pressure 140 systolic.  Chest x-ray consistent with a right lower lobe pneumonia. We'll check labs. The patient continues to appear very well, with no complaints. Normal oxygen saturation on her 2 L at baseline, normal vitals. Labs are pending, we will dose IV Levaquin while in the emergency department, patient care signed out to Dr. Dolores Frame.  ____________________________________________   FINAL CLINICAL IMPRESSION(S) / ED DIAGNOSES  Allergic reaction Pneumonia   Minna Antis, MD 08/04/15 2312

## 2015-08-04 NOTE — ED Notes (Addendum)
Pt came from [facility] via EMS. Pt was eating a hot dog with chili. About 2 hours ago, staff noticed patient had some swelling and redness around mouth.  Patient was given PO Benadryl 50 mg at [facility] about 2 hours ago also given  Prednisone PO and 1 Duoneb due to oxygen sats at 83% on RA.  Pt presents with some wheezing and rhonchi in both lungs.

## 2015-08-05 LAB — CBC WITH DIFFERENTIAL/PLATELET
Basophils Absolute: 0.1 10*3/uL (ref 0–0.1)
Basophils Relative: 1 %
Eosinophils Absolute: 0.3 10*3/uL (ref 0–0.7)
Eosinophils Relative: 4 %
HEMATOCRIT: 31.4 % — AB (ref 35.0–47.0)
HEMOGLOBIN: 10.4 g/dL — AB (ref 12.0–16.0)
Lymphocytes Relative: 9 %
Lymphs Abs: 0.8 10*3/uL — ABNORMAL LOW (ref 1.0–3.6)
MCH: 29 pg (ref 26.0–34.0)
MCHC: 33 g/dL (ref 32.0–36.0)
MCV: 87.8 fL (ref 80.0–100.0)
Monocytes Absolute: 0.6 10*3/uL (ref 0.2–0.9)
Neutro Abs: 7.1 10*3/uL — ABNORMAL HIGH (ref 1.4–6.5)
Neutrophils Relative %: 79 %
Platelets: 73 10*3/uL — ABNORMAL LOW (ref 150–440)
RBC: 3.57 MIL/uL — ABNORMAL LOW (ref 3.80–5.20)
RDW: 18.3 % — ABNORMAL HIGH (ref 11.5–14.5)
WBC: 8.9 10*3/uL (ref 3.6–11.0)

## 2015-08-05 LAB — COMPREHENSIVE METABOLIC PANEL
ALBUMIN: 2.9 g/dL — AB (ref 3.5–5.0)
ALK PHOS: 70 U/L (ref 38–126)
ALT: 19 U/L (ref 14–54)
ANION GAP: 8 (ref 5–15)
AST: 36 U/L (ref 15–41)
BUN: 20 mg/dL (ref 6–20)
CALCIUM: 8.2 mg/dL — AB (ref 8.9–10.3)
CO2: 23 mmol/L (ref 22–32)
Chloride: 113 mmol/L — ABNORMAL HIGH (ref 101–111)
Creatinine, Ser: 0.82 mg/dL (ref 0.44–1.00)
GFR calc Af Amer: >60 mL/min (ref >60)
GFR, EST NON AFRICAN AMERICAN: 59 mL/min — AB (ref >60)
Glucose, Bld: 93 mg/dL (ref 65–99)
POTASSIUM: 3.2 mmol/L — AB (ref 3.5–5.1)
SODIUM: 144 mmol/L (ref 135–145)
TOTAL PROTEIN: 5.4 g/dL — AB (ref 6.5–8.1)
Total Bilirubin: 1.4 mg/dL — ABNORMAL HIGH (ref 0.3–1.2)

## 2015-08-05 LAB — TROPONIN I: Troponin I: 0.03 ng/mL (ref <0.031)

## 2015-08-05 MED ORDER — PREDNISONE 20 MG PO TABS: ORAL_TABLET | ORAL | Status: AC

## 2015-08-05 MED ORDER — LEVOFLOXACIN 750 MG PO TABS: 750.0000 mg | ORAL_TABLET | Freq: Every day | ORAL | Status: AC

## 2015-08-05 NOTE — ED Provider Notes (Signed)
-----------------------------------------   12:19 AM on 08/05/2015 -----------------------------------------  Patient resting in no acute distress. BP 149/68. 99% on baseline 2L/min oxygen. Very mild expiratory wheeze right lower lung which clears with auscultation. Currently awaiting lab results.  ----------------------------------------- 2:12 AM on 08/05/2015 -----------------------------------------  Delay secondary to computer downtime. Laboratory results notable for mild hypokalemia and mildly elevated bilirubin. Patient is nontender to palpation in her abdomen to light and deep palpation, particularly in her right upper quadrant. Will continue patient on Levaquin for suspected pneumonia, prednisone for a total of 5 days to help with both COPD exacerbation as well as probable allergic reaction.Marland Kitchen Saturations are 100% on patient's baseline oxygenation; patient is normotensive. Strict return precautions given. Patient verbalizes understanding and agrees with plan of care.  Irean Hong, MD 08/05/15 [phone]

## 2015-08-05 NOTE — ED Notes (Addendum)
Prior note is an error.  Documented on wrong patient.

## 2015-08-05 NOTE — Discharge Instructions (Signed)
You have been seen for a possible allergic reaction. Please take your prescribed prednisone for the next 5 days. Take Levaquin 750 mg daily 6 days. Take Benadryl 25 mg by mouth every 6 hours as needed for any swelling, or itching. Return to the emergency department immediately for any swelling in your mouth, tongue, left, any trouble breathing, or any other symptom personally concerning to yourself or nurses.     Allergies Allergies may happen from anything your body is sensitive to. This may be food, medicines, pollens, chemicals, and nearly anything around you in everyday life that produces allergens. An allergen is anything that causes an allergy producing substance. Heredity is often a factor in causing these problems. This means you may have some of the same allergies as your parents. Food allergies happen in all age groups. Food allergies are some of the most severe and life threatening. Some common food allergies are cow's milk, seafood, eggs, nuts, wheat, and soybeans. SYMPTOMS   Swelling around the mouth.  An itchy red rash or hives.  Vomiting or diarrhea.  Difficulty breathing. SEVERE ALLERGIC REACTIONS ARE LIFE-THREATENING. This reaction is called anaphylaxis. It can cause the mouth and throat to swell and cause difficulty with breathing and swallowing. In severe reactions only a trace amount of food (for example, peanut oil in a salad) may cause death within seconds. Seasonal allergies occur in all age groups. These are seasonal because they usually occur during the same season every year. They may be a reaction to molds, grass pollens, or tree pollens. Other causes of problems are house dust mite allergens, pet dander, and mold spores. The symptoms often consist of nasal congestion, a runny itchy nose associated with sneezing, and tearing itchy eyes. There is often an associated itching of the mouth and ears. The problems happen when you come in contact with pollens and other  allergens. Allergens are the particles in the air that the body reacts to with an allergic reaction. This causes you to release allergic antibodies. Through a chain of events, these eventually cause you to release histamine into the blood stream. Although it is meant to be protective to the body, it is this release that causes your discomfort. This is why you were given anti-histamines to feel better. If you are unable to pinpoint the offending allergen, it may be determined by skin or blood testing. Allergies cannot be cured but can be controlled with medicine. Hay fever is a collection of all or some of the seasonal allergy problems. It may often be treated with simple over-the-counter medicine such as diphenhydramine. Take medicine as directed. Do not drink alcohol or drive while taking this medicine. Check with your caregiver or package insert for child dosages. If these medicines are not effective, there are many new medicines your caregiver can prescribe. Stronger medicine such as nasal spray, eye drops, and corticosteroids may be used if the first things you try do not work well. Other treatments such as immunotherapy or desensitizing injections can be used if all else fails. Follow up with your caregiver if problems continue. These seasonal allergies are usually not life threatening. They are generally more of a nuisance that can often be handled using medicine. HOME CARE INSTRUCTIONS   If unsure what causes a reaction, keep a diary of foods eaten and symptoms that follow. Avoid foods that cause reactions.  If hives or rash are present:  Take medicine as directed.  You may use an over-the-counter antihistamine (diphenhydramine) for hives and itching  as needed.  Apply cold compresses (cloths) to the skin or take baths in cool water. Avoid hot baths or showers. Heat will make a rash and itching worse.  If you are severely allergic:  Following a treatment for a severe reaction, hospitalization  is often required for closer follow-up.  Wear a medic-alert bracelet or necklace stating the allergy.  You and your family must learn how to give adrenaline or use an anaphylaxis kit.  If you have had a severe reaction, always carry your anaphylaxis kit or EpiPen with you. Use this medicine as directed by your caregiver if a severe reaction is occurring. Failure to do so could have a fatal outcome. SEEK MEDICAL CARE IF:  You suspect a food allergy. Symptoms generally happen within 30 minutes of eating a food.  Your symptoms have not gone away within 2 days or are getting worse.  You develop new symptoms.  You want to retest yourself or your child with a food or drink you think causes an allergic reaction. Never do this if an anaphylactic reaction to that food or drink has happened before. Only do this under the care of a caregiver. SEEK IMMEDIATE MEDICAL CARE IF:   You have difficulty breathing, are wheezing, or have a tight feeling in your chest or throat.  You have a swollen mouth, or you have hives, swelling, or itching all over your body.  You have had a severe reaction that has responded to your anaphylaxis kit or an EpiPen. These reactions may return when the medicine has worn off. These reactions should be considered life threatening. MAKE SURE YOU:   Understand these instructions.  Will watch your condition.  Will get help right away if you are not doing well or get worse. Document Released: 03/07/2003 Document Revised: 04/08/2013 Document Reviewed: 08/11/2008 [facility] Patient Information 2015 [place], [place]. This information is not intended to replace advice given to you by your health care provider. Make sure you discuss any questions you have with your health care provider.

## 2015-08-05 NOTE — ED Notes (Signed)
Turned, repositioned.  sSkin care given.  Linen changed.

## 2015-08-05 NOTE — ED Notes (Signed)
Patient picked up by [facility] and transported to [facility].

## 2015-08-06 ENCOUNTER — Emergency Department
Admission: EM | Admit: 2015-08-06 | Discharge: 2015-08-06 | Disposition: A | Discharge status: Skilled Nursing Facility | Payer: Medicare Other | Attending: Emergency Medicine | Admitting: Emergency Medicine

## 2015-08-06 DIAGNOSIS — R05 Cough: Secondary | ICD-10-CM | POA: Insufficient documentation

## 2015-08-06 DIAGNOSIS — Z79899 Other long term (current) drug therapy: Secondary | ICD-10-CM | POA: Diagnosis not present

## 2015-08-06 DIAGNOSIS — K6289 Other specified diseases of anus and rectum: Secondary | ICD-10-CM | POA: Diagnosis present

## 2015-08-06 DIAGNOSIS — K623 Rectal prolapse: Secondary | ICD-10-CM | POA: Diagnosis not present

## 2015-08-06 DIAGNOSIS — F0390 Unspecified dementia without behavioral disturbance: Secondary | ICD-10-CM | POA: Diagnosis not present

## 2015-08-06 NOTE — ED Notes (Addendum)
Pt roomed and hooked to monitor.  Pt turned to inspect bottom.  Visualized large mass at rectum, no bleeding noted.  Patient resting in stretcher. Respirations even and unlabored. No obvious distress. Cardiac monitor in place. No needs/concerns verbalized at this time. Call bell within reach. Encouraged to call with needs. Will continue to monitor.

## 2015-08-06 NOTE — Discharge Instructions (Signed)
As I discussed by phone with your nurse, as well as the patient's daughter and granddaugther, Mia Solomon has a rectal prolapse.  This is not dangerous, but there is also not an easy fix other than gently pushing the rectum back inside.  We did this in the Emergency Department, but it will happen again, and may have happened already.  I discussed the case by phone with Dr. Michela Pitcher, our general surgeon, who recommends that Mia Solomon follow up in clinic with either himself or one of his colleagues to discuss the condition and management options.  We recommend that one of your nursing home staff and/or the patient's family also accompany her on her appointment to help discuss the plan of care.  If she develops any new or worsening symptoms that concern you, please have her return to the emergency Department.  Otherwise we recommend that you apply gentle pressure and reduce the prolapse if you see it, but anticipate that it will happen again.

## 2015-08-06 NOTE — ED Provider Notes (Signed)
[facility] Emergency Department Provider Note  ____________________________________________  Time seen: Approximately 4:00 PM  I have reviewed the triage vital signs and the nursing notes.   HISTORY  Chief Complaint Rectal Problems  History is limited by chronic dementia  HPI Mia Solomon is a 81 y.o. female with multiple chronic medical problems including dementiaand a recent diagnosis of pneumonia with a cough presents from her nursing home with a "rectal mass".  The circumstances are unclear, but reportedly she was sent by the staff because of a possible rectocele.  The patient smiles upon me entering the room and is in no acute distress.  She knows her name but is otherwise disoriented.  She says that her "bottom" is uncomfortable but otherwise she has no complaints at this time.   Past Medical History  Diagnosis Date  . Arthritis   . COPD (chronic obstructive pulmonary disease)   . CHF (congestive heart failure)   . Rectal prolapse     There are no active problems to display for this patient.   Past Surgical History  Procedure Laterality Date  . Triple bypass    . Congested heart failure      Current Outpatient Rx  Name  Route  Sig  Dispense  Refill  . citalopram (CELEXA) 10 MG tablet   Oral   Take 10 mg by mouth daily.         . diphenhydrAMINE (SOMINEX) 25 MG tablet   Oral   Take 25 mg by mouth at bedtime as needed for allergies.         Marland Kitchen donepezil (ARICEPT) 5 MG tablet   Oral   Take 5 mg by mouth 2 (two) times daily.         Marland Kitchen ipratropium-albuterol (DUONEB) 0.5-2.5 (3) MG/3ML SOLN   Nebulization   Take 3 mLs by nebulization every 4 (four) hours as needed.         Marland Kitchen levofloxacin (LEVAQUIN) 750 MG tablet   Oral   Take 1 tablet (750 mg total) by mouth daily.   6 tablet   0   . memantine (NAMENDA XR) 7 MG CP24 24 hr capsule   Oral   Take 7 mg by mouth daily.         . predniSONE (DELTASONE) 10 MG tablet  Oral   Take 60 mg by mouth daily with breakfast. Given stat for allergic reaction         . predniSONE (DELTASONE) 20 MG tablet      3 tablets daily x 4 days   12 tablet   0     Allergies Review of patient's allergies indicates no known allergies.  No family history on file.  Social History Social History  Substance Use Topics  . Smoking status: Never Smoker   . Smokeless tobacco: None  . Alcohol Use: No    Review of Systems Unable to obtain from patient due to chronic dementia  ____________________________________________   PHYSICAL EXAM: ED Triage Vitals  Enc Vitals Group     BP 08/06/15 1554 152/84 mmHg     Pulse Rate 08/06/15 1554 88     Resp 08/06/15 1554 24     Temp 08/06/15 1554 98.2 F (36.8 C)     Temp Source 08/06/15 1554 Oral     SpO2 08/06/15 1554 96 %     Weight --      Height --      Head Cir --  Peak Flow --      Pain Score 08/06/15 1556 0     Pain Loc --      Pain Edu? --      Excl. in GC? --       Constitutional: Alert and oriented to self in the setting of chronic dementia.  Elderly female in no acute distress  Eyes: Conjunctivae are normal. PERRL. EOMI. Head: Atraumatic. Nose: No congestion/rhinnorhea. Mouth/Throat: Mucous membranes are moist.  Oropharynx non-erythematous. Neck: No stridor.   Cardiovascular: Normal rate, regular rhythm. Grossly normal heart sounds.  Good peripheral circulation. Respiratory: Normal respiratory effort.  No retractions. Lungs CTAB.  No cough observed in the emergency department Gastrointestinal: Soft and nontender. No distention. No abdominal bruits. No CVA tenderness. Rectal:  Large rectal prolapse, greater than the size of my fist.  No bleeding.  Small amount of normal-appearing stool is present.  Nontender to palpation.  Easily reducible but quickly prolapses again. Musculoskeletal: No lower extremity tenderness nor edema.  No joint effusions.   Neurologic:  Normal speech and language. No gross  focal neurologic deficits are appreciated.   ____________________________________________   LABS (all labs ordered are listed, but only abnormal results are displayed)  Not indicated ____________________________________________  EKG  Not indicated ____________________________________________  RADIOLOGY  Not indicated  ____________________________________________   PROCEDURES  Procedure(s) performed: None  Critical Care performed: No ____________________________________________   INITIAL IMPRESSION / ASSESSMENT AND PLAN / ED COURSE  Pertinent labs & imaging results that were available during my care of the patient were reviewed by me and considered in my medical decision making (see chart for details).  The patient has a large rectal prolapse.  In the setting of a recent diagnosis for pneumonia and cough, the patient likely strained from coughing and cause the prolapse.  She is not in pain and her vital signs are normal.  I attempted manual reduction and it did completely reduce but then begins to prolapse again quickly.  I spoke by phone with the surgeon, Dr. Michela Pitcher, and we discussed management options.  He agrees that the patient is not a good surgical candidate given her age and medical comorbidities including dementia.  A pessary is a possible option, but again would be difficult to manage given the patient's dementia and living in a nursing home.  His recommendation was to discuss the situation with the nursing home staff and have them follow-up in Dr. Marlowe Kays clinic to see him or one of his colleagues to discuss additional management, but there is no role for emergent intervention at this time.  I reevaluated the patient and she again has a prolapse though it is smaller than the original.  This time I covered the prolapse with granulated sugar to attempt an osmotic reduction.  This was quite successful, reducing the size of the prolapse, and I then again completed the manual  reduction.  However, by the time we got the patient situated again with her undergarments in place, I could tell that the rectum was again beginning to prolapse.  I have called the patient's granddaughter, her healthcare power of attorney and emergency contact, and I will discuss the case by phone with her, and then we will attempt to discuss the situation with the nursing home and stressed the importance of outpatient follow-up.  ----------------------------------------- 6:08 PM on 08/06/2015 -----------------------------------------  No callback give from the granddaughter.  I just placed a call to the patient's daughter who is also listed as an emergency contact I left a  voice mail as well, maintaining the patient's privacy at the same time.  ----------------------------------------- 10:14 PM on 08/06/2015 ----------------------------------------- (Note that documentation was delayed due to multiple ED patients requiring immediate care.)  I had the opportunity shortly after the last time stamp to speak by phone with both the granddaughter who was confirmed as a healthcare power of attorney, and the patient's daughter, who does not have legal 30 but obviously is interested in her mother's care.  I explained to both of them in great detail the diagnosis of rectal prolapse, why she is not a great surgical candidate, and the plan for outpatient follow-up.  Subsequently after several attempts I was able to speak by phone with the nurse in charge of the patient's care at the nursing home.  She understands the diagnosis well and is very comfortable with management.  I reiterated to her the need for follow-up in the surgery clinic and she understands and agrees with the plan.  The patient was stable throughout her stay in the emergency department.  She was transported back to her facility by EMS for outpatient follow-up.  ____________________________________________  FINAL CLINICAL IMPRESSION(S) / ED  DIAGNOSES  Final diagnoses:  Rectal prolapse      NEW MEDICATIONS STARTED DURING THIS VISIT:  New Prescriptions   No medications on file     Loleta Rose, MD 08/06/15 2216

## 2015-08-06 NOTE — ED Notes (Signed)
Pt brought to ED via EMS from [facility] w/ c/o "rectal seal".  Per EMS pt was sent by MD w/ rectal seal. Per EMS pts VS stable.  Pt denies pain at this time.

## 2015-08-10 LAB — CULTURE, BLOOD (ROUTINE X 2)
CULTURE: NO GROWTH
CULTURE: NO GROWTH

## 2015-08-27 DEATH — deceased

## 2016-09-24 IMAGING — CR DG CHEST 2V
1 series · 2 of 2 positions shown · non-contrast
Comparison: Prior chest x-ray 01/05/2015

CLINICAL DATA: [AGE] female with wheezing and productive
cough for the past 3 days

EXAM:
CHEST  2 VIEW

[Series 1: dg chest 2 view · 0.14mm/px · 2 of 2 slices shown]
[im 1/2]
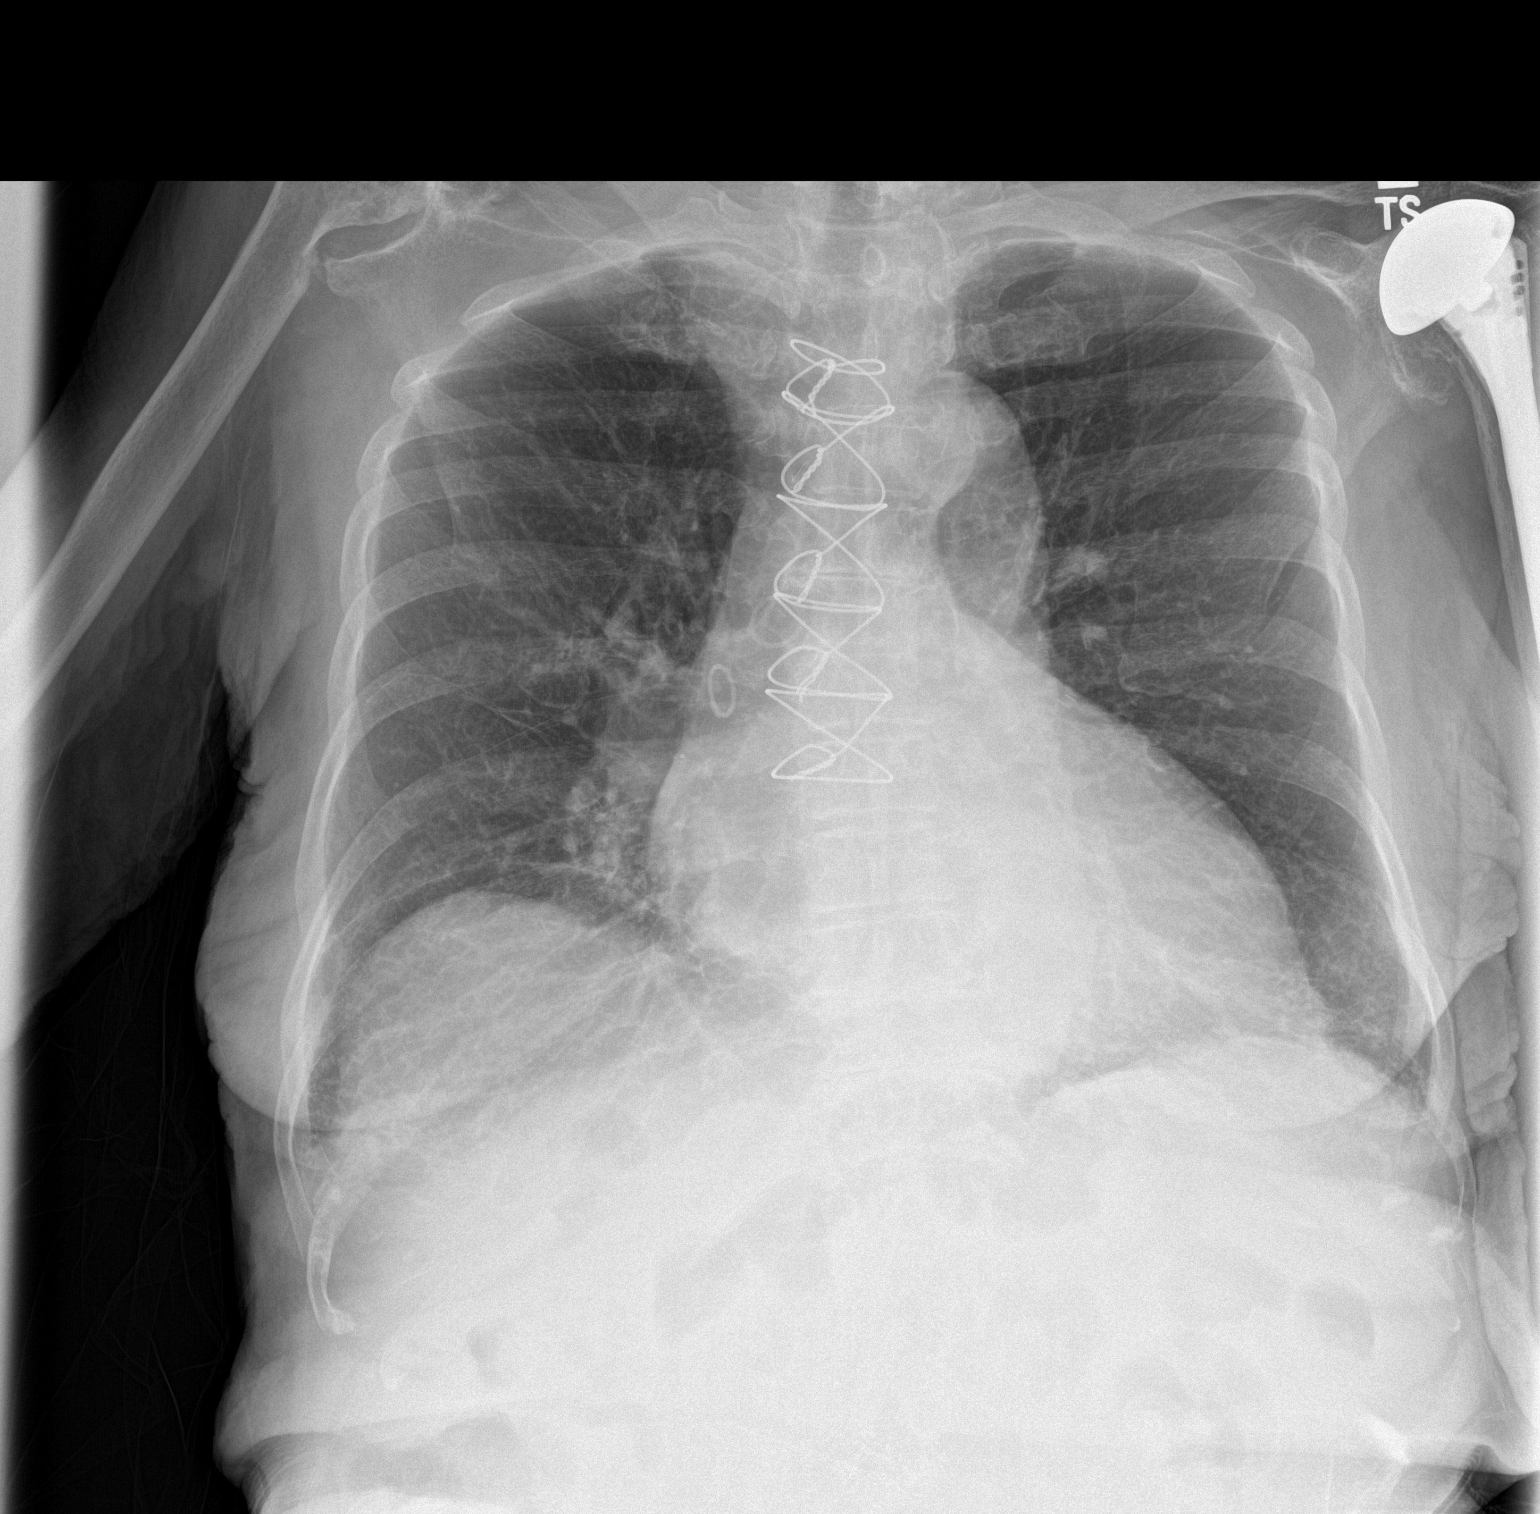
[im 2/2]
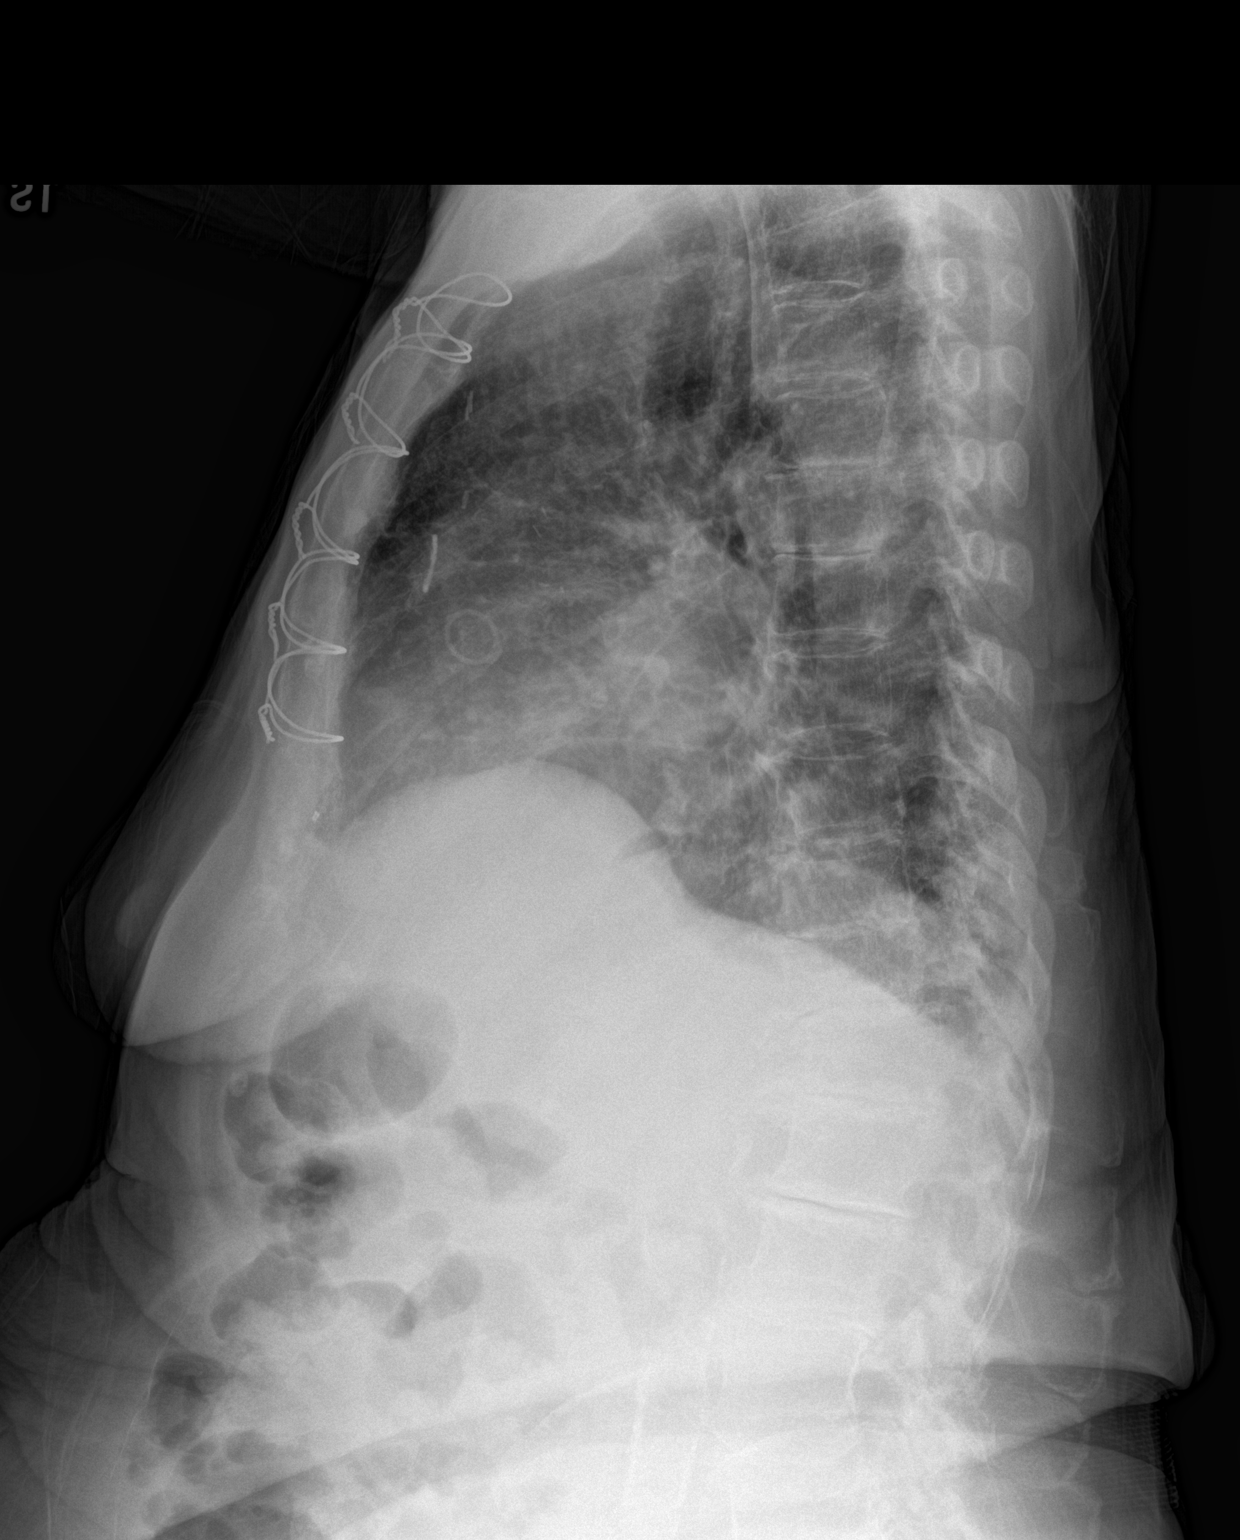

[2 of 2 positions shown; findings below may reference images not displayed]

FINDINGS: Stable cardiomegaly. Patient is status post median sternotomy with
evidence of prior multivessel CABG. Mediastinal contours remain
unchanged. Negative for pulmonary edema, focal airspace
consolidation, pleural effusion or pneumothorax. Stable central
bronchitic change and bibasilar atelectasis versus scarring.

Stable surgical changes of prior left shoulder arthroplasty.
Advanced degenerative change of the right shoulder joint. No acute
osseous abnormality. Atherosclerotic vascular calcifications present
throughout the aorta.
IMPRESSION: 1. Stable chest x-ray without evidence of acute cardiopulmonary
process.
2. Stable cardiomegaly, aortic atherosclerosis, chronic bronchitic
change and bibasilar scarring versus atelectasis.
